# Patient Record
Sex: Female | Born: 1960 | Race: White | Hispanic: No | Marital: Single | Smoking: Former smoker
Health system: Southern US, Community
[De-identification: ages and names within clinical notes are randomized; demographics above are authoritative.]

## PROBLEM LIST (undated history)

## (undated) DIAGNOSIS — E079 Disorder of thyroid, unspecified: Secondary | ICD-10-CM

## (undated) DIAGNOSIS — M1990 Unspecified osteoarthritis, unspecified site: Secondary | ICD-10-CM

## (undated) DIAGNOSIS — K589 Irritable bowel syndrome without diarrhea: Secondary | ICD-10-CM

## (undated) HISTORY — PX: CHOLECYSTECTOMY: SHX55

## (undated) HISTORY — PX: ABDOMINAL HYSTERECTOMY: SHX81

---

## 2005-12-21 ENCOUNTER — Ambulatory Visit: Payer: Self-pay | Admitting: General Practice

## 2007-11-15 ENCOUNTER — Ambulatory Visit: Payer: Self-pay | Admitting: Family Medicine

## 2008-03-05 ENCOUNTER — Ambulatory Visit: Payer: Self-pay | Admitting: Family Medicine

## 2011-05-07 ENCOUNTER — Ambulatory Visit: Payer: Self-pay | Admitting: Otolaryngology

## 2011-08-21 ENCOUNTER — Ambulatory Visit: Payer: Self-pay

## 2012-09-23 ENCOUNTER — Ambulatory Visit: Payer: Self-pay | Admitting: Emergency Medicine

## 2012-09-23 LAB — RAPID STREP-A WITH REFLX: Micro Text Report: NEGATIVE

## 2012-09-25 LAB — BETA STREP CULTURE(ARMC)

## 2012-10-05 IMAGING — CR DG CHEST 2V
1 series · 2 of 2 positions shown · non-contrast
Comparison: none

REASON FOR EXAM: + productive cough with diminished breath sounds
COMMENTS:   LMP: Post Hysterectomy

[Series 1: pa · 0.17mm/px · 2 of 2 slices shown]
[im 1/2]
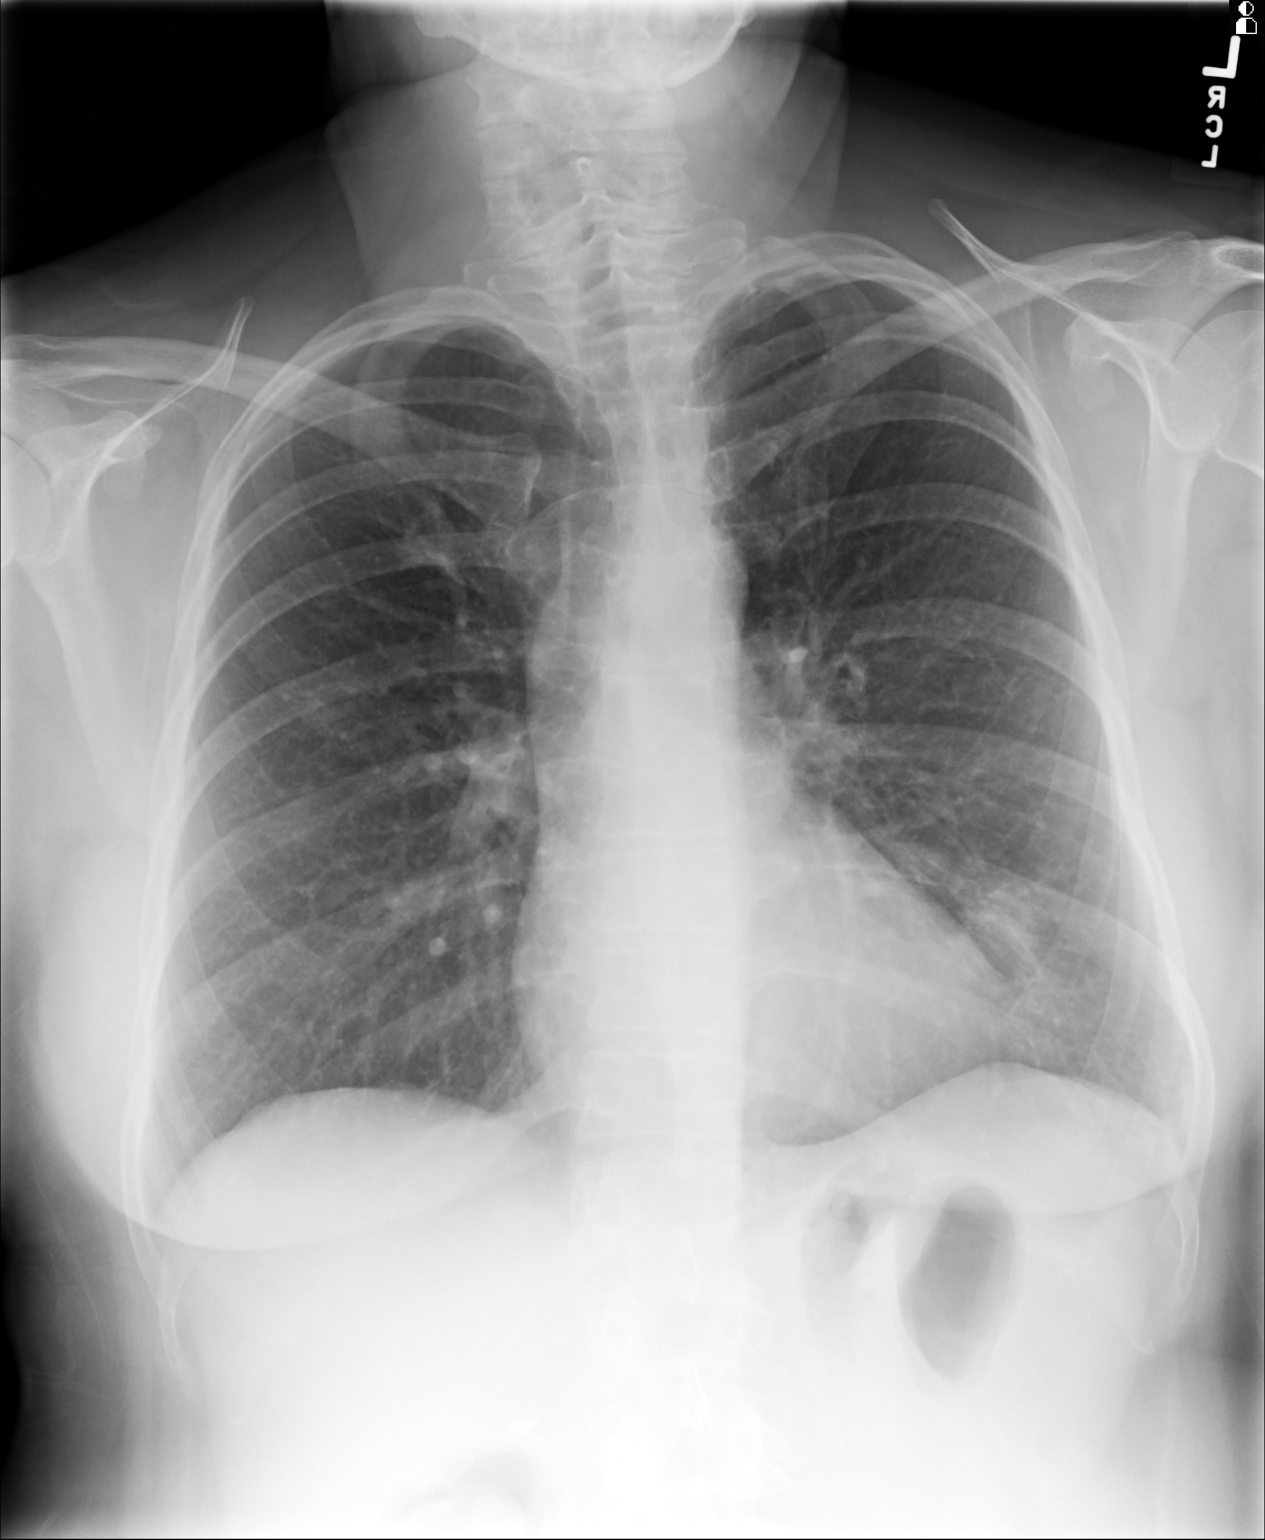
[im 2/2]
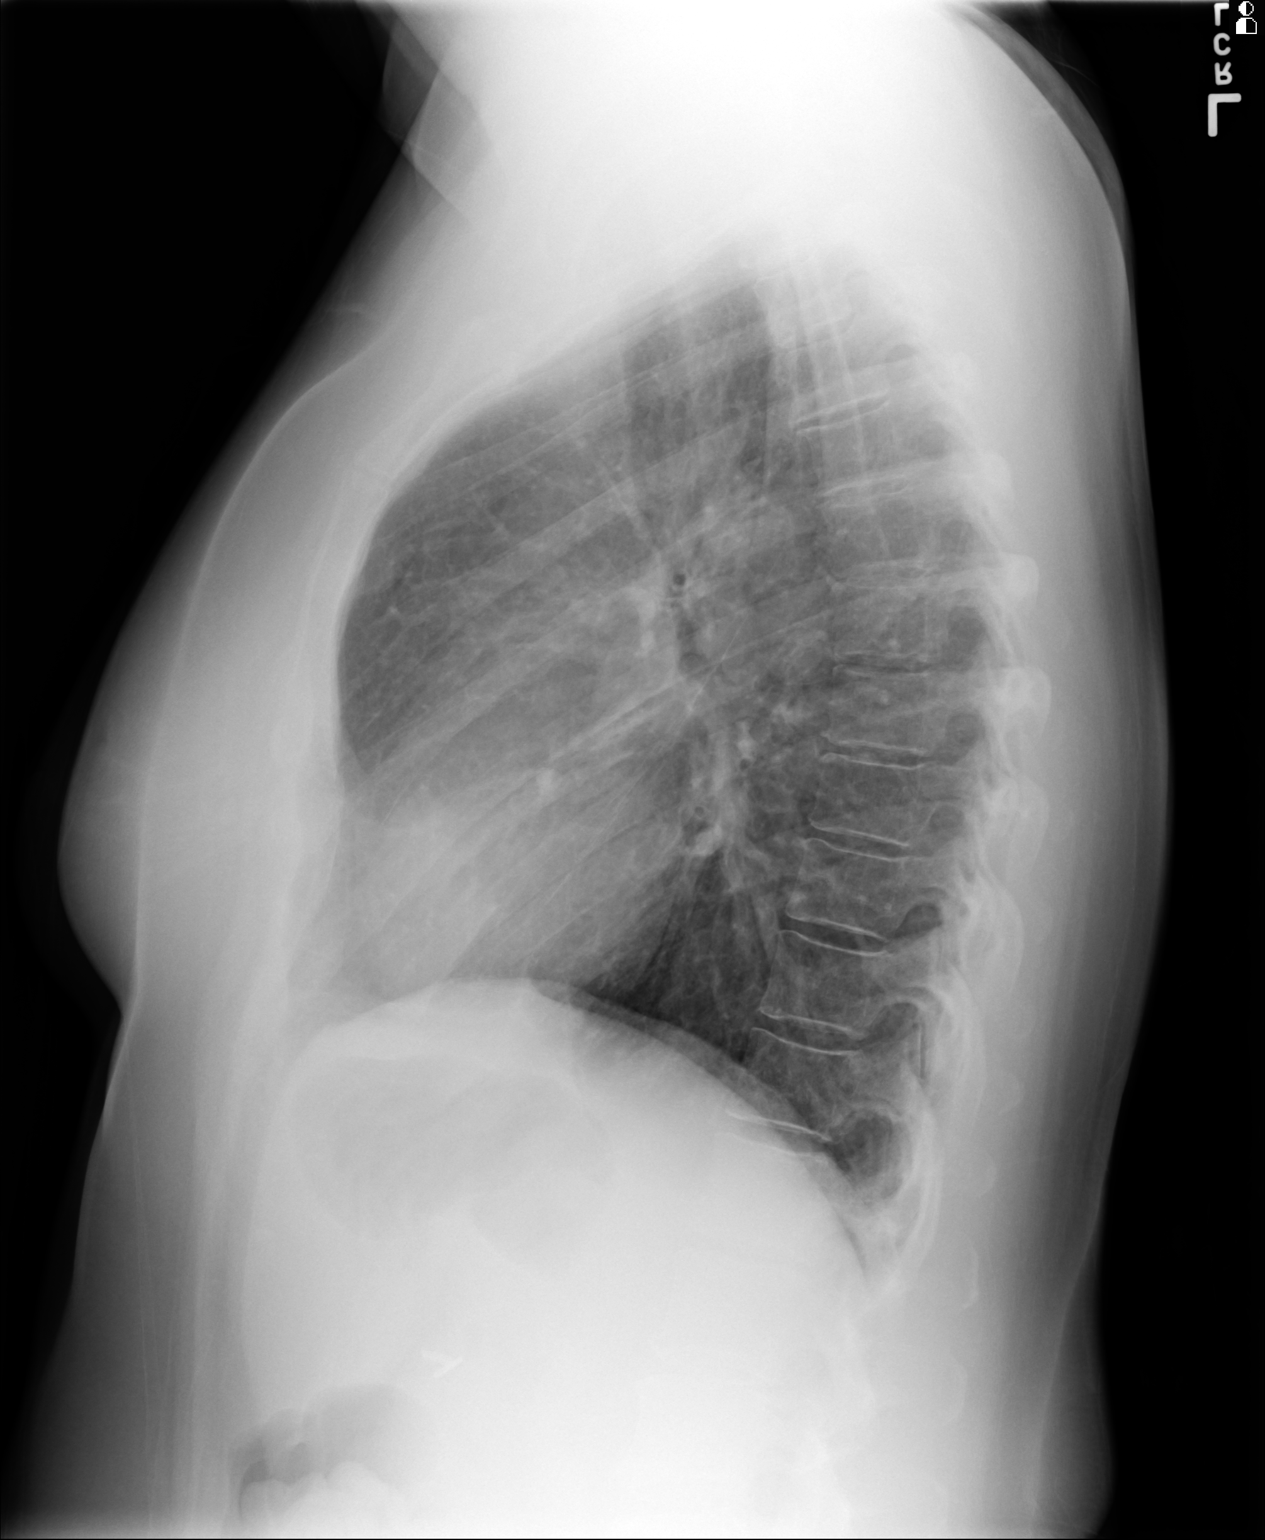

[2 of 2 positions shown; findings below may reference images not displayed]

PROCEDURE:     MDR - MDR CHEST PA(OR AP) AND LATERAL  - August 21, 2011  [DATE]

RESULT:     Comparison is made to the study 07 May, 2011.

The lungs are well-expanded. There is patchy density in the lingula
suggesting atelectasis or early pneumonia. The cardiac silhouette is normal
in size. The pulmonary vascularity is not engorged. I see no significant
pleural fluid collection. The mediastinum is normal in width.
IMPRESSION: The findings are consistent with atelectasis or developing
infiltrate in the lingula. Followup films following therapy would be useful
to assure clearing.

## 2013-11-08 ENCOUNTER — Ambulatory Visit: Payer: Self-pay | Admitting: Emergency Medicine

## 2013-11-08 ENCOUNTER — Emergency Department: Payer: Self-pay | Admitting: Emergency Medicine

## 2013-11-08 LAB — COMPREHENSIVE METABOLIC PANEL
AST: 24 U/L (ref 15–37)
Albumin: 3.6 g/dL (ref 3.4–5.0)
Alkaline Phosphatase: 107 U/L
Anion Gap: 6 — ABNORMAL LOW (ref 7–16)
BILIRUBIN TOTAL: 0.3 mg/dL (ref 0.2–1.0)
BUN: 14 mg/dL (ref 7–18)
CREATININE: 0.62 mg/dL (ref 0.60–1.30)
Calcium, Total: 9.8 mg/dL (ref 8.5–10.1)
Chloride: 105 mmol/L (ref 98–107)
Co2: 28 mmol/L (ref 21–32)
EGFR (African American): >60
EGFR (Non-African Amer.): >60
Glucose: 77 mg/dL (ref 65–99)
OSMOLALITY: 277 (ref 275–301)
POTASSIUM: 3.6 mmol/L (ref 3.5–5.1)
SGPT (ALT): 30 U/L (ref 12–78)
Sodium: 139 mmol/L (ref 136–145)
Total Protein: 7.6 g/dL (ref 6.4–8.2)

## 2013-11-08 LAB — CBC
HCT: 42.9 % (ref 35.0–47.0)
HGB: 14.3 g/dL (ref 12.0–16.0)
MCH: 30.4 pg (ref 26.0–34.0)
MCHC: 33.3 g/dL (ref 32.0–36.0)
MCV: 91 fL (ref 80–100)
PLATELETS: 282 10*3/uL (ref 150–440)
RBC: 4.7 10*6/uL (ref 3.80–5.20)
RDW: 13 % (ref 11.5–14.5)
WBC: 10.8 10*3/uL (ref 3.6–11.0)

## 2013-11-08 LAB — CK TOTAL AND CKMB (NOT AT ARMC)
CK, TOTAL: 82 U/L
CK-MB: 1 ng/mL (ref 0.5–3.6)

## 2013-11-08 LAB — TROPONIN I

## 2013-11-12 DIAGNOSIS — R079 Chest pain, unspecified: Secondary | ICD-10-CM | POA: Insufficient documentation

## 2013-11-12 DIAGNOSIS — J029 Acute pharyngitis, unspecified: Secondary | ICD-10-CM | POA: Insufficient documentation

## 2014-04-25 ENCOUNTER — Ambulatory Visit: Payer: Self-pay

## 2014-08-29 ENCOUNTER — Ambulatory Visit: Payer: Self-pay

## 2014-09-01 ENCOUNTER — Ambulatory Visit: Payer: Self-pay

## 2016-08-10 ENCOUNTER — Emergency Department: Payer: BLUE CROSS/BLUE SHIELD

## 2016-08-10 ENCOUNTER — Inpatient Hospital Stay
Admission: EM | Admit: 2016-08-10 | Discharge: 2016-08-11 | DRG: 392 | Disposition: A | Discharge status: Home / Self Care | Payer: BLUE CROSS/BLUE SHIELD | Attending: Internal Medicine | Admitting: Internal Medicine

## 2016-08-10 DIAGNOSIS — Z885 Allergy status to narcotic agent status: Secondary | ICD-10-CM

## 2016-08-10 DIAGNOSIS — E876 Hypokalemia: Secondary | ICD-10-CM | POA: Diagnosis present

## 2016-08-10 DIAGNOSIS — K625 Hemorrhage of anus and rectum: Secondary | ICD-10-CM | POA: Diagnosis present

## 2016-08-10 DIAGNOSIS — J101 Influenza due to other identified influenza virus with other respiratory manifestations: Secondary | ICD-10-CM | POA: Diagnosis present

## 2016-08-10 DIAGNOSIS — A09 Infectious gastroenteritis and colitis, unspecified: Secondary | ICD-10-CM | POA: Diagnosis present

## 2016-08-10 DIAGNOSIS — K922 Gastrointestinal hemorrhage, unspecified: Secondary | ICD-10-CM

## 2016-08-10 DIAGNOSIS — Z9049 Acquired absence of other specified parts of digestive tract: Secondary | ICD-10-CM | POA: Diagnosis not present

## 2016-08-10 DIAGNOSIS — K529 Noninfective gastroenteritis and colitis, unspecified: Secondary | ICD-10-CM

## 2016-08-10 DIAGNOSIS — Z880 Allergy status to penicillin: Secondary | ICD-10-CM

## 2016-08-10 HISTORY — DX: Irritable bowel syndrome, unspecified: K58.9

## 2016-08-10 LAB — COMPREHENSIVE METABOLIC PANEL
ALBUMIN: 3.9 g/dL (ref 3.5–5.0)
ALT: 32 U/L (ref 14–54)
AST: 35 U/L (ref 15–41)
Alkaline Phosphatase: 80 U/L (ref 38–126)
Anion gap: 8 (ref 5–15)
BUN: 18 mg/dL (ref 6–20)
CHLORIDE: 102 mmol/L (ref 101–111)
CO2: 28 mmol/L (ref 22–32)
Calcium: 9 mg/dL (ref 8.9–10.3)
Creatinine, Ser: 0.8 mg/dL (ref 0.44–1.00)
GFR calc Af Amer: >60 mL/min (ref >60)
Glucose, Bld: 99 mg/dL (ref 65–99)
POTASSIUM: 3.3 mmol/L — AB (ref 3.5–5.1)
SODIUM: 138 mmol/L (ref 135–145)
Total Bilirubin: 0.6 mg/dL (ref 0.3–1.2)
Total Protein: 7.4 g/dL (ref 6.5–8.1)

## 2016-08-10 LAB — INFLUENZA PANEL BY PCR (TYPE A & B)
INFLAPCR: POSITIVE — AB
Influenza B By PCR: NEGATIVE

## 2016-08-10 LAB — CBC
HEMATOCRIT: 44.5 % (ref 35.0–47.0)
Hemoglobin: 15.4 g/dL (ref 12.0–16.0)
MCH: 31 pg (ref 26.0–34.0)
MCHC: 34.7 g/dL (ref 32.0–36.0)
MCV: 89.3 fL (ref 80.0–100.0)
Platelets: 247 10*3/uL (ref 150–440)
RBC: 4.98 MIL/uL (ref 3.80–5.20)
RDW: 13.1 % (ref 11.5–14.5)
WBC: 6.2 10*3/uL (ref 3.6–11.0)

## 2016-08-10 LAB — PROTIME-INR
INR: 0.99
PROTHROMBIN TIME: 13.1 s (ref 11.4–15.2)

## 2016-08-10 LAB — TYPE AND SCREEN
ABO/RH(D): O POS
Antibody Screen: NEGATIVE

## 2016-08-10 LAB — MAGNESIUM: Magnesium: 2 mg/dL (ref 1.7–2.4)

## 2016-08-10 LAB — LIPASE, BLOOD: Lipase: 23 U/L (ref 11–51)

## 2016-08-10 LAB — APTT: APTT: 30 s (ref 24–36)

## 2016-08-10 LAB — HEMOGLOBIN: HEMOGLOBIN: 14.3 g/dL (ref 12.0–16.0)

## 2016-08-10 MED ORDER — FENTANYL CITRATE (PF) 100 MCG/2ML IJ SOLN
INTRAMUSCULAR | Status: AC
  Filled 2016-08-10: qty 2

## 2016-08-10 MED ORDER — ACETAMINOPHEN 650 MG RE SUPP: 650.0000 mg | Freq: Four times a day (QID) | RECTAL | Status: DC | PRN

## 2016-08-10 MED ORDER — CIPROFLOXACIN IN D5W 400 MG/200ML IV SOLN
400.0000 mg | Freq: Two times a day (BID) | INTRAVENOUS | Status: DC
  Administered 2016-08-11: 400 mg via INTRAVENOUS
  Filled 2016-08-10 (×3): qty 200

## 2016-08-10 MED ORDER — ONDANSETRON HCL 4 MG/2ML IJ SOLN
4.0000 mg | Freq: Once | INTRAMUSCULAR | Status: DC
  Filled 2016-08-10: qty 2

## 2016-08-10 MED ORDER — FENTANYL CITRATE (PF) 100 MCG/2ML IJ SOLN: 50.0000 ug | Freq: Once | INTRAMUSCULAR | Status: DC

## 2016-08-10 MED ORDER — CIPROFLOXACIN IN D5W 400 MG/200ML IV SOLN
400.0000 mg | Freq: Once | INTRAVENOUS | Status: AC
  Administered 2016-08-10: 400 mg via INTRAVENOUS

## 2016-08-10 MED ORDER — METRONIDAZOLE IN NACL 5-0.79 MG/ML-% IV SOLN
500.0000 mg | Freq: Once | INTRAVENOUS | Status: AC
  Administered 2016-08-10: 500 mg via INTRAVENOUS

## 2016-08-10 MED ORDER — POTASSIUM CHLORIDE IN NACL 20-0.9 MEQ/L-% IV SOLN
INTRAVENOUS | Status: DC
  Administered 2016-08-10 – 2016-08-11 (×2): via INTRAVENOUS
  Filled 2016-08-10 (×5): qty 1000

## 2016-08-10 MED ORDER — SODIUM CHLORIDE 0.9% FLUSH
3.0000 mL | Freq: Two times a day (BID) | INTRAVENOUS | Status: DC
  Administered 2016-08-10: 3 mL via INTRAVENOUS

## 2016-08-10 MED ORDER — IOPAMIDOL (ISOVUE-300) INJECTION 61%
30.0000 mL | Freq: Once | INTRAVENOUS | Status: AC
  Administered 2016-08-10: 30 mL via ORAL
  Filled 2016-08-10: qty 30

## 2016-08-10 MED ORDER — CIPROFLOXACIN IN D5W 400 MG/200ML IV SOLN
INTRAVENOUS | Status: AC
  Administered 2016-08-10: 400 mg via INTRAVENOUS
  Filled 2016-08-10: qty 200

## 2016-08-10 MED ORDER — METRONIDAZOLE IN NACL 5-0.79 MG/ML-% IV SOLN
500.0000 mg | Freq: Three times a day (TID) | INTRAVENOUS | Status: DC
  Administered 2016-08-11 (×2): 500 mg via INTRAVENOUS
  Filled 2016-08-10 (×5): qty 100

## 2016-08-10 MED ORDER — IOPAMIDOL (ISOVUE-300) INJECTION 61%
100.0000 mL | Freq: Once | INTRAVENOUS | Status: AC | PRN
  Administered 2016-08-10: 100 mL via INTRAVENOUS
  Filled 2016-08-10: qty 100

## 2016-08-10 MED ORDER — OLOPATADINE HCL 0.1 % OP SOLN
1.0000 [drp] | Freq: Two times a day (BID) | OPHTHALMIC | Status: DC
  Filled 2016-08-10: qty 5

## 2016-08-10 MED ORDER — PANTOPRAZOLE SODIUM 40 MG IV SOLR
40.0000 mg | Freq: Two times a day (BID) | INTRAVENOUS | Status: DC
  Administered 2016-08-10: 40 mg via INTRAVENOUS
  Filled 2016-08-10 (×2): qty 40

## 2016-08-10 MED ORDER — OSELTAMIVIR PHOSPHATE 75 MG PO CAPS
75.0000 mg | ORAL_CAPSULE | Freq: Two times a day (BID) | ORAL | Status: DC
  Administered 2016-08-10 – 2016-08-11 (×2): 75 mg via ORAL
  Filled 2016-08-10 (×2): qty 1

## 2016-08-10 MED ORDER — SODIUM CHLORIDE 0.9 % IV BOLUS (SEPSIS)
1000.0000 mL | Freq: Once | INTRAVENOUS | Status: AC
  Administered 2016-08-10: 1000 mL via INTRAVENOUS

## 2016-08-10 MED ORDER — ONDANSETRON HCL 4 MG PO TABS: 4.0000 mg | ORAL_TABLET | Freq: Four times a day (QID) | ORAL | Status: DC | PRN

## 2016-08-10 MED ORDER — METRONIDAZOLE IN NACL 5-0.79 MG/ML-% IV SOLN
INTRAVENOUS | Status: AC
  Administered 2016-08-10: 500 mg via INTRAVENOUS
  Filled 2016-08-10: qty 100

## 2016-08-10 MED ORDER — OLOPATADINE HCL 0.7 % OP SOLN
1.0000 [drp] | Freq: Every day | OPHTHALMIC | Status: DC
  Filled 2016-08-10: qty 2.5

## 2016-08-10 MED ORDER — ONDANSETRON HCL 4 MG/2ML IJ SOLN
4.0000 mg | Freq: Four times a day (QID) | INTRAMUSCULAR | Status: DC | PRN
  Administered 2016-08-11 (×2): 4 mg via INTRAVENOUS
  Filled 2016-08-10 (×2): qty 2

## 2016-08-10 MED ORDER — ACETAMINOPHEN 325 MG PO TABS
650.0000 mg | ORAL_TABLET | Freq: Four times a day (QID) | ORAL | Status: DC | PRN
  Administered 2016-08-11: 650 mg via ORAL
  Filled 2016-08-10: qty 2

## 2016-08-10 NOTE — ED Notes (Signed)
Transporting patient to room 226-2C with EDT Almyra Free

## 2016-08-10 NOTE — H&P (Addendum)
[facility] [place] NAME: Yolanda Guerrero    MR#:  [id number]  DATE OF BIRTH:  20-May-1961  DATE OF ADMISSION:  08/10/2016  PRIMARY CARE PHYSICIAN: Verita Lamb, NP   REQUESTING/REFERRING PHYSICIAN:   CHIEF COMPLAINT:   Chief Complaint  Patient presents with  . Rectal Bleeding  . Diarrhea    HISTORY OF PRESENT ILLNESS: Yolanda Guerrero  is a 56 y.o. female with a known history of Irritable bowel syndrome, who presents to the hospital with complaints of rectal bleed. However, due to the patient, she was doing well up until 4 days ago, which is that he had a high fever and headache. She was seen at urgent care 2 days ago, they were not able to test her for influenza, so they gave her a Zithromax prescription as well as Zofran for nausea. Patient has been experiencing and she was sent back home. After returning back home next day she started having lower abdominal pain and rectal bleeding. She has been going to the bathroom numerous times, however, not for diarrhea but for intermittent rectal bleeding. The blood was described as bright red. No clots are competent with some tenesmus but not diarrheal stool. Patient also admitted of nausea and a few episodes of vomiting, no hematemesis.  On arrival to the hospital the patient was not hypotensive, but tachycardic. Labs revealed influenza A. Hospitalist services were contacted for admission  PAST MEDICAL HISTORY:   Past Medical History:  Diagnosis Date  . IBS (irritable bowel syndrome)     PAST SURGICAL HISTORY: History reviewed. No pertinent surgical history.  SOCIAL HISTORY:  Social History  Substance Use Topics  . Smoking status: Never Smoker  . Smokeless tobacco: Never Used  . Alcohol use No    FAMILY HISTORY: .  DRUG ALLERGIES:  Allergies  Allergen Reactions  . Codeine Nausea And Vomiting  . Penicillins Hives    Has patient had a PCN reaction causing immediate rash,  facial/tongue/throat swelling, SOB or lightheadedness with hypotension: No Has patient had a PCN reaction causing severe rash involving mucus membranes or skin necrosis: No Has patient had a PCN reaction that required hospitalization: No Has patient had a PCN reaction occurring within the last 10 years: Yes If all of the above answers are "NO", then may proceed with Cephalosporin use.  Marland Kitchen Percocet [Oxycodone-Acetaminophen] Nausea And Vomiting  . Tramadol Nausea And Vomiting    Review of Systems  Constitutional: Positive for fever and malaise/fatigue. Negative for chills and weight loss.  HENT: Positive for congestion.   Eyes: Negative for blurred vision and double vision.  Respiratory: Positive for cough and sputum production. Negative for shortness of breath and wheezing.   Cardiovascular: Negative for chest pain, palpitations, orthopnea, leg swelling and PND.  Gastrointestinal: Positive for abdominal pain, blood in stool, constipation, nausea and vomiting. Negative for diarrhea.  Genitourinary: Negative for dysuria, frequency, hematuria and urgency.  Musculoskeletal: Negative for falls.  Neurological: Negative for dizziness, tremors, focal weakness and headaches.  Endo/Heme/Allergies: Does not bruise/bleed easily.  Psychiatric/Behavioral: Negative for depression. The patient does not have insomnia.     MEDICATIONS AT HOME:  Prior to Admission medications   Medication Sig Start Date End Date Taking? Authorizing Provider  azithromycin (ZITHROMAX) 250 MG tablet Take 250-500 mg by mouth daily. Take 2 tablets on day 1 then take 1 tablet daily until complete. 08/09/16 08/14/16 Yes Historical Provider, MD  ondansetron (ZOFRAN-ODT) 4 MG disintegrating tablet Take 4 mg  by mouth every 8 (eight) hours as needed. 08/08/16 08/15/16 Yes Historical Provider, MD  PAZEO 0.7 % SOLN Apply 1 drop to eye daily. 06/26/16  Yes Historical Provider, MD      PHYSICAL EXAMINATION:   VITAL SIGNS: Blood pressure (!)  144/87, pulse (!) 102, temperature 98.2 F (36.8 C), temperature source Oral, resp. rate 18, height 5\' 3"  (1.6 m), weight 79.8 kg (176 lb), SpO2 98 %.  GENERAL:  56 y.o.-year-old patient lying in the bed in mild distress due to abdominal discomfort, intermittent cough.  EYES: Pupils equal, round, reactive to light and accommodation. No scleral icterus. Extraocular muscles intact.  HEENT: Head atraumatic, normocephalic. Oropharynx and nasopharynx clear.  NECK:  Supple, no jugular venous distention. No thyroid enlargement, no tenderness.  LUNGS: Markedly diminished breath sounds bilaterally posteriorly, no wheezing, rales,rhonchi or crepitation. No use of accessory muscles of respiration.  CARDIOVASCULAR: S1, S2 normal. No murmurs, rubs, or gallops.  ABDOMEN: Soft, tender in left lower quadrant but no rebound or guarding, nondistended. Bowel sounds present. No organomegaly or mass.  EXTREMITIES: Trace lower extremity and pedal edema, no cyanosis, or clubbing.  NEUROLOGIC: Cranial nerves II through XII are intact. Muscle strength 5/5 in all extremities. Sensation intact. Gait not checked.  PSYCHIATRIC: The patient is alert and oriented x 3.  SKIN: No obvious rash, lesion, or ulcer.   LABORATORY PANEL:   CBC  Recent Labs Lab 08/10/16 1145  WBC 6.2  HGB 15.4  HCT 44.5  PLT 247  MCV 89.3  MCH 31.0  MCHC 34.7  RDW 13.1   ------------------------------------------------------------------------------------------------------------------  Chemistries   Recent Labs Lab 08/10/16 1145  NA 138  K 3.3*  CL 102  CO2 28  GLUCOSE 99  BUN 18  CREATININE 0.80  CALCIUM 9.0  AST 35  ALT 32  ALKPHOS 80  BILITOT 0.6   ------------------------------------------------------------------------------------------------------------------  Cardiac Enzymes No results for input(s): TROPONINI in the last 168  hours. ------------------------------------------------------------------------------------------------------------------  RADIOLOGY: Ct Abdomen Pelvis W Contrast  Result Date: 08/10/2016 CLINICAL DATA:  Acute onset of fever, headache and left lower quadrant abdominal pain. Initial encounter. EXAM: CT ABDOMEN AND PELVIS WITH CONTRAST TECHNIQUE: Multidetector CT imaging of the abdomen and pelvis was performed using the standard protocol following bolus administration of intravenous contrast. CONTRAST:  150mL ISOVUE-300 IOPAMIDOL (ISOVUE-300) INJECTION 61% COMPARISON:  None. FINDINGS: Lower chest: The visualized lung bases are grossly clear. The visualized portions of the mediastinum are unremarkable. Hepatobiliary: A 1.9 cm hypodensity is noted at the posterior aspect of the liver. The patient is status post cholecystectomy, with clips noted at the gallbladder fossa. The common bile duct remains within normal limits given prior cholecystectomy. Pancreas: The pancreas is within normal limits. Spleen: The spleen is unremarkable in appearance. Adrenals/Urinary Tract: The adrenal glands are unremarkable in appearance. The kidneys are within normal limits. There is no evidence of hydronephrosis. No renal or ureteral stones are identified. No perinephric stranding is seen. Stomach/Bowel: The appendix is not visualized; there is no evidence of appendicitis. Contrast progresses to the level of the mid transverse colon. There is diffuse mucosal edema and soft tissue inflammation along the distal transverse and descending colon, concerning for infectious or inflammatory segmental colitis. There is no definite evidence of ischemia. The sigmoid colon is unremarkable in appearance. Small bowel loops are unremarkable in appearance. The stomach is partially filled with contrast and within normal limits. Vascular/Lymphatic: Scattered calcification is seen along the abdominal aorta and its branches. The abdominal aorta is  otherwise grossly  unremarkable. The inferior vena cava is grossly unremarkable. No retroperitoneal lymphadenopathy is seen. No pelvic sidewall lymphadenopathy is identified. Reproductive: The bladder is mildly distended and grossly unremarkable in appearance. The patient is status post hysterectomy. No suspicious adnexal masses are seen. The ovaries are relatively symmetric in appearance. Other: No additional soft tissue abnormalities are seen. Musculoskeletal: No acute osseous abnormalities are identified. The visualized musculature is unremarkable in appearance. IMPRESSION: 1. Diffuse mucosal edema and soft tissue inflammation along the distal transverse and descending colon, concerning for segmental colitis, either infectious or inflammatory in nature. 2. Nonspecific 1.9 cm hypodensity at the posterior aspect of the liver. Would correlate with LFTs, and consider further evaluation if deemed clinically appropriate. 3. Scattered aortic atherosclerosis. Electronically Signed   By: Garald Balding M.D.   On: 08/10/2016 18:07    EKG: Orders placed or performed during the hospital encounter of 08/10/16  . ED EKG  . ED EKG  EKG in the ED revealed normal sinus rhythm at 96 bpm, normal axis, nonspecific ST-T changes  IMPRESSION AND PLAN:  Active Problems:   Gastrointestinal bleeding   Acute hemorrhagic colitis   Influenza A   Hypokalemia #1. Gastrointestinal bleeding due to colitis, concerning for ischemic due to location, sedimentation medical floor, continue IV fluids, discussed the risks as well as benefits of transfusion, patient was agreeable for transfusion if needed, get gastroenterologist involved for further recommendations, initiate PPI intravenously twice a day, initiate clear liquid diet. Follow hemoglobin level and transfuse as needed #2. Acute colitis, questionable ischemic due to location, initiate patient on Cipro and Flagyl intravenously clear liquid diet, advance diet as tolerated #3  influenza A, start Tamiflu #4. Hypokalemia, supplement intravenously   All the records are reviewed and case discussed with ED provider. Management plans discussed with the patient, family and they are in agreement.  CODE STATUS: Code Status History    This patient does not have a recorded code status. Please follow your organizational policy for patients in this situation.       TOTAL TIME TAKING CARE OF THIS PATIENT: 50 minutes.    Theodoro Grist M.D on 08/10/2016 at 8:43 PM  Between 7am to 6pm - Pager - [phone] After 6pm go to [email or web address] - password [email or web address] [facility]  [facility]  Office  [phone]  CC: Primary care physician; Verita Lamb, NP

## 2016-08-10 NOTE — ED Triage Notes (Signed)
Pt started on azithromycin on Sunday by PCP, diarrhea and bright red stools with diarrhea since. Lower abdominal cramping. Pt alert and oriented X4, active, cooperative, pt in NAD. RR even and unlabored, color WNL.

## 2016-08-10 NOTE — ED Provider Notes (Signed)
[facility] Emergency Department Provider Note  ____________________________________________  Time seen: Approximately 3:30 PM  I have reviewed the triage vital signs and the nursing notes.   HISTORY  Chief Complaint Rectal Bleeding and Diarrhea    HPI Yolanda Guerrero is a 56 y.o. female with a history of IBS presenting with bright red blood per rectum. The patient reports that 5 or 6 days ago, she developed a headache with fever and nausea, and was put on Zofran and azithromycinby her primary care physician. Shortly after taking her first dose of azithromycin, she developed severe abdominal cramping and diarrhea that lasted approximately one hour. Since then, she has persisted in having diarrhea, and yesterday started having just bright red blood mixed with mucus. She does have a history of hemorrhoids. Today, she continued to have more bleeding, and developed lightheadedness with standing. She denies any syncope, chest pain or shortness of breath. She is not had a fever in greater than 48 hours.   Past Medical History:  Diagnosis Date  . IBS (irritable bowel syndrome)     There are no active problems to display for this patient.   History reviewed. No pertinent surgical history.    Allergies Codeine; Penicillins; and Percocet [oxycodone-acetaminophen]  No family history on file.  Social History Social History  Substance Use Topics  . Smoking status: Never Smoker  . Smokeless tobacco: Never Used  . Alcohol use No    Review of Systems Constitutional: Positive fever. Positive myalgias. Positive general malaise. Positive lightheadedness with standing. Negative syncope. Eyes: No visual changes. ENT: No sore throat. No congestion or rhinorrhea. Cardiovascular: Denies chest pain. Denies palpitations. Respiratory: Denies shortness of breath.  Positive cough. Gastrointestinal: Positive lower abdominal cramping, worse on the left side.  No nausea,  no vomiting.  Positive diarrhea.  No constipation. Positive bright red blood per rectum. Genitourinary: Negative for dysuria. Musculoskeletal: Negative for back pain. Skin: Negative for rash. Neurological: Negative for headaches. No focal numbness, tingling or weakness.   10-point ROS otherwise negative.  ____________________________________________   PHYSICAL EXAM:  VITAL SIGNS: ED Triage Vitals  Enc Vitals Group     BP 08/10/16 1146 (!) 144/87     Pulse Rate 08/10/16 1146 (!) 102     Resp 08/10/16 1146 18     Temp 08/10/16 1146 98.2 F (36.8 C)     Temp Source 08/10/16 1146 Oral     SpO2 08/10/16 1146 98 %     Weight 08/10/16 1147 176 lb (79.8 kg)     Height 08/10/16 1147 5\' 3"  (1.6 m)     Head Circumference --      Peak Flow --      Pain Score 08/10/16 1147 5     Pain Loc --      Pain Edu? --      Excl. in [place]? --     Constitutional: Alert and oriented. Well appearing and in no acute distress. Answers questions appropriately. Eyes: Conjunctivae are normalAnd without pallor.  EOMI. No scleral icterus. Head: Atraumatic. Nose: No congestion/rhinnorhea. Mouth/Throat: Mucous membranes are moist.  Neck: No stridor.  Supple.  No meningismus. Cardiovascular: Normal rate, regular rhythm. No murmurs, rubs or gallops.  Respiratory: Normal respiratory effort.  No accessory muscle use or retractions. Lungs CTAB.  No wheezes, rales or ronchi. Gastrointestinal: Soft, overweight, and nondistended. Mild tenderness to palpation localized to the left lower quadrant. No guarding or rebound.  No peritoneal signs. Genitourinary: Multiple nonthrombosed, nonbleeding hemorrhoids externally. Positive  palpable internal hemorrhoid. Positive right red blood on my rectal examination is guaiac positive. Musculoskeletal: No LE edema.  Neurologic:  A&Ox3.  Speech is clear.  Face and smile are symmetric.  EOMI.  Moves all extremities well. Skin:  Skin is warm, dry and intact. No rash  noted. Psychiatric: Mood and affect are normal. Speech and behavior are normal.  Normal judgement.  ____________________________________________   LABS (all labs ordered are listed, but only abnormal results are displayed)  Labs Reviewed  COMPREHENSIVE METABOLIC PANEL - Abnormal; Notable for the following:       Result Value   Potassium 3.3 (*)    All other components within normal limits  INFLUENZA PANEL BY PCR (TYPE A & B) - Abnormal; Notable for the following:    Influenza A By PCR POSITIVE (*)    All other components within normal limits  CBC  LIPASE, BLOOD  PROTIME-INR  APTT  TYPE AND SCREEN   ____________________________________________  EKG  ED ECG REPORT I, Eula Listen, the attending physician, personally viewed and interpreted this ECG.   Date: 08/10/2016  EKG Time: 1647  Rate: 96  Rhythm: normal sinus rhythm  Axis: normal  Intervals:none  ST&T Change: Nonspecific T-wave inversions in V1. No ST elevations.  ____________________________________________  RADIOLOGY  Ct Abdomen Pelvis W Contrast  Result Date: 08/10/2016 CLINICAL DATA:  Acute onset of fever, headache and left lower quadrant abdominal pain. Initial encounter. EXAM: CT ABDOMEN AND PELVIS WITH CONTRAST TECHNIQUE: Multidetector CT imaging of the abdomen and pelvis was performed using the standard protocol following bolus administration of intravenous contrast. CONTRAST:  161mL ISOVUE-300 IOPAMIDOL (ISOVUE-300) INJECTION 61% COMPARISON:  None. FINDINGS: Lower chest: The visualized lung bases are grossly clear. The visualized portions of the mediastinum are unremarkable. Hepatobiliary: A 1.9 cm hypodensity is noted at the posterior aspect of the liver. The patient is status post cholecystectomy, with clips noted at the gallbladder fossa. The common bile duct remains within normal limits given prior cholecystectomy. Pancreas: The pancreas is within normal limits. Spleen: The spleen is unremarkable in  appearance. Adrenals/Urinary Tract: The adrenal glands are unremarkable in appearance. The kidneys are within normal limits. There is no evidence of hydronephrosis. No renal or ureteral stones are identified. No perinephric stranding is seen. Stomach/Bowel: The appendix is not visualized; there is no evidence of appendicitis. Contrast progresses to the level of the mid transverse colon. There is diffuse mucosal edema and soft tissue inflammation along the distal transverse and descending colon, concerning for infectious or inflammatory segmental colitis. There is no definite evidence of ischemia. The sigmoid colon is unremarkable in appearance. Small bowel loops are unremarkable in appearance. The stomach is partially filled with contrast and within normal limits. Vascular/Lymphatic: Scattered calcification is seen along the abdominal aorta and its branches. The abdominal aorta is otherwise grossly unremarkable. The inferior vena cava is grossly unremarkable. No retroperitoneal lymphadenopathy is seen. No pelvic sidewall lymphadenopathy is identified. Reproductive: The bladder is mildly distended and grossly unremarkable in appearance. The patient is status post hysterectomy. No suspicious adnexal masses are seen. The ovaries are relatively symmetric in appearance. Other: No additional soft tissue abnormalities are seen. Musculoskeletal: No acute osseous abnormalities are identified. The visualized musculature is unremarkable in appearance. IMPRESSION: 1. Diffuse mucosal edema and soft tissue inflammation along the distal transverse and descending colon, concerning for segmental colitis, either infectious or inflammatory in nature. 2. Nonspecific 1.9 cm hypodensity at the posterior aspect of the liver. Would correlate with LFTs, and consider further evaluation  if deemed clinically appropriate. 3. Scattered aortic atherosclerosis. Electronically Signed   By: Garald Balding M.D.   On: 08/10/2016 18:07     ____________________________________________   PROCEDURES  Procedure(s) performed: None  Procedures  Critical Care performed: No ____________________________________________   INITIAL IMPRESSION / ASSESSMENT AND PLAN / ED COURSE  Pertinent labs & imaging results that were available during my care of the patient were reviewed by me and considered in my medical decision making (see chart for details).  56 y.o. female with a history of IBS and hemorrhoids, presenting with a febrile illness associated with headache, cough, and now with diarrhea and bright red blood per rectum, left lower quadrant pain. It is possible that the patient has influenza. However she may have 2 concurrent illnesses going on. I would also consider a viral or bacterial URI or pneumonia, and we will evaluate for diverticulitis. Her bleeding may also be hemorrhoid related given my physical exam findings. At this time, the patient is hemodynamically stable with a reassuring blood count and is not a candidate for transfusion at this time. However, given that she is having ongoing bleeding, she'll need to be monitored in the hospital.  ----------------------------------------- 5:34 PM on 08/10/2016 -----------------------------------------  The patient is positive for influenza A. Her symptoms started several days ago, and she is not a candidate for therapeutic relief from Tamiflu. I'm awaiting the results of her CT scan for final disposition  ----------------------------------------- 6:26 PM on 08/10/2016 -----------------------------------------  The patient has diffuse colitis on her CT scan. I will plan to treat her with ciprofloxacin and Flagyl for this. She will be admitted to the hospital given that she has ongoing bleeding, and in addition to an intra-abdominal infection also has influenza. ____________________________________________  FINAL CLINICAL IMPRESSION(S) / ED DIAGNOSES  Final diagnoses:   Influenza A  Colitis         NEW MEDICATIONS STARTED DURING THIS VISIT:  New Prescriptions   No medications on file      Eula Listen, MD 08/10/16 1827

## 2016-08-11 LAB — BASIC METABOLIC PANEL
ANION GAP: 7 (ref 5–15)
BUN: 12 mg/dL (ref 6–20)
CO2: 26 mmol/L (ref 22–32)
Calcium: 8 mg/dL — ABNORMAL LOW (ref 8.9–10.3)
Chloride: 105 mmol/L (ref 101–111)
Creatinine, Ser: 0.65 mg/dL (ref 0.44–1.00)
GFR calc Af Amer: >60 mL/min (ref >60)
GFR calc non Af Amer: >60 mL/min (ref >60)
GLUCOSE: 139 mg/dL — AB (ref 65–99)
POTASSIUM: 3.3 mmol/L — AB (ref 3.5–5.1)
Sodium: 138 mmol/L (ref 135–145)

## 2016-08-11 LAB — C DIFFICILE QUICK SCREEN W PCR REFLEX
C DIFFICLE (CDIFF) ANTIGEN: NEGATIVE
C Diff interpretation: NOT DETECTED
C Diff toxin: NEGATIVE

## 2016-08-11 LAB — CBC
HEMATOCRIT: 37.2 % (ref 35.0–47.0)
HEMOGLOBIN: 13 g/dL (ref 12.0–16.0)
MCH: 31.3 pg (ref 26.0–34.0)
MCHC: 34.9 g/dL (ref 32.0–36.0)
MCV: 89.8 fL (ref 80.0–100.0)
Platelets: 214 10*3/uL (ref 150–440)
RBC: 4.15 MIL/uL (ref 3.80–5.20)
RDW: 13 % (ref 11.5–14.5)
WBC: 4.7 10*3/uL (ref 3.6–11.0)

## 2016-08-11 LAB — GASTROINTESTINAL PANEL BY PCR, STOOL (REPLACES STOOL CULTURE)
ADENOVIRUS F40/41: NOT DETECTED
Astrovirus: NOT DETECTED
CRYPTOSPORIDIUM: NOT DETECTED
CYCLOSPORA CAYETANENSIS: NOT DETECTED
Campylobacter species: NOT DETECTED
ENTAMOEBA HISTOLYTICA: NOT DETECTED
Enteroaggregative E coli (EAEC): NOT DETECTED
Enteropathogenic E coli (EPEC): NOT DETECTED
Enterotoxigenic E coli (ETEC): NOT DETECTED
Giardia lamblia: NOT DETECTED
Norovirus GI/GII: NOT DETECTED
PLESIMONAS SHIGELLOIDES: NOT DETECTED
Rotavirus A: NOT DETECTED
SAPOVIRUS (I, II, IV, AND V): NOT DETECTED
SHIGA LIKE TOXIN PRODUCING E COLI (STEC): NOT DETECTED
Salmonella species: NOT DETECTED
Shigella/Enteroinvasive E coli (EIEC): NOT DETECTED
VIBRIO CHOLERAE: NOT DETECTED
VIBRIO SPECIES: NOT DETECTED
YERSINIA ENTEROCOLITICA: NOT DETECTED

## 2016-08-11 LAB — GLUCOSE, CAPILLARY: Glucose-Capillary: 86 mg/dL (ref 65–99)

## 2016-08-11 LAB — HEMOGLOBIN: Hemoglobin: 14 g/dL (ref 12.0–16.0)

## 2016-08-11 MED ORDER — CIPROFLOXACIN HCL 500 MG PO TABS: 500.0000 mg | ORAL_TABLET | Freq: Two times a day (BID) | ORAL | 0 refills | Status: DC

## 2016-08-11 MED ORDER — POTASSIUM CHLORIDE CRYS ER 20 MEQ PO TBCR
40.0000 meq | EXTENDED_RELEASE_TABLET | Freq: Once | ORAL | Status: AC
Start: 2016-08-11 — End: 2016-08-11
  Administered 2016-08-11: 40 meq via ORAL
  Filled 2016-08-11: qty 2

## 2016-08-11 MED ORDER — METRONIDAZOLE 500 MG PO TABS: 500.0000 mg | ORAL_TABLET | Freq: Three times a day (TID) | ORAL | 0 refills | Status: DC

## 2016-08-11 MED ORDER — OSELTAMIVIR PHOSPHATE 75 MG PO CAPS: 75.0000 mg | ORAL_CAPSULE | Freq: Two times a day (BID) | ORAL | 0 refills | Status: DC

## 2016-08-11 NOTE — Discharge Summary (Signed)
[facility] [place] NAME: Yolanda Guerrero    MR#:  [id number]  DATE OF BIRTH:  11/08/1960  DATE OF ADMISSION:  08/10/2016   ADMITTING PHYSICIAN: Theodoro Grist, MD  DATE OF DISCHARGE: No discharge date for patient encounter.  PRIMARY CARE PHYSICIAN: Verita Lamb, NP   ADMISSION DIAGNOSIS:   Colitis [K52.9] Influenza A [J10.1]  DISCHARGE DIAGNOSIS:   Active Problems:   Gastrointestinal bleeding   Acute hemorrhagic colitis   Influenza A   Hypokalemia   SECONDARY DIAGNOSIS:   Past Medical History:  Diagnosis Date  . IBS (irritable bowel syndrome)     HOSPITAL COURSE:   56yo female with a PMH of irritable bowel syndrome presented to the hospital with rectal bleeding, lower abdominal pain, nausea and vomiting. CT scan confirmed inflammation of the distal transverse/descending colon, consistent with gastrointestinal bleed due to colitis - concerning for ischemia due to location. No history of afib of vascular issues. Patient was also found to be positive for Influenza A and also mildly hypokalemic. She was given intravenous fluids with potassium supplementation and started on IV Protonix twice a day, IV cipro, IV flagyl, Tamiflu and a clear liquid diet. Oral potassium was initiated today to assist. Hemoglobin levels have gone from 14.3 to 13, but stable given rehydration. Patient was able to tolerate soft diet today and denied any bleeding, pain, nausea or vomiting. She has a scheduled appointment with her PCP in February and will f/u with GI as outpatient.   1) GI bleed due to colitis - Patient will continue cipro & flagyl by mouth for total 10 day course  - follow up with GI as outpatient - schedule colonoscopy   2) Influenza A - Continue Tamiflu by mouth for total 5 day course  4) Hypokalemia  - resolved  DISCHARGE CONDITIONS:   Stable. Taking good nutrition by mouth. Ready for discharge.  CONSULTS OBTAINED:    N/A  DRUG ALLERGIES:   Allergies  Allergen Reactions  . Codeine Nausea And Vomiting  . Penicillins Hives    Has patient had a PCN reaction causing immediate rash, facial/tongue/throat swelling, SOB or lightheadedness with hypotension: No Has patient had a PCN reaction causing severe rash involving mucus membranes or skin necrosis: No Has patient had a PCN reaction that required hospitalization: No Has patient had a PCN reaction occurring within the last 10 years: Yes If all of the above answers are "NO", then may proceed with Cephalosporin use.  Marland Kitchen Percocet [Oxycodone-Acetaminophen] Nausea And Vomiting  . Tramadol Nausea And Vomiting   DISCHARGE MEDICATIONS:   Allergies as of 08/11/2016      Reactions   Codeine Nausea And Vomiting   Penicillins Hives   Has patient had a PCN reaction causing immediate rash, facial/tongue/throat swelling, SOB or lightheadedness with hypotension: No Has patient had a PCN reaction causing severe rash involving mucus membranes or skin necrosis: No Has patient had a PCN reaction that required hospitalization: No Has patient had a PCN reaction occurring within the last 10 years: Yes If all of the above answers are "NO", then may proceed with Cephalosporin use.   Percocet [oxycodone-acetaminophen] Nausea And Vomiting   Tramadol Nausea And Vomiting      Medication List    STOP taking these medications   azithromycin 250 MG tablet Commonly known as:  ZITHROMAX     TAKE these medications   ciprofloxacin 500 MG tablet Commonly known as:  CIPRO Take 1 tablet (500 mg  total) by mouth 2 (two) times daily. X 9 more days   metroNIDAZOLE 500 MG tablet Commonly known as:  FLAGYL Take 1 tablet (500 mg total) by mouth 3 (three) times daily. X 9 more days   ondansetron 4 MG disintegrating tablet Commonly known as:  ZOFRAN-ODT Take 4 mg by mouth every 8 (eight) hours as needed.   oseltamivir 75 MG capsule Commonly known as:  TAMIFLU Take 1 capsule (75  mg total) by mouth 2 (two) times daily. X 4 more days   PAZEO 0.7 % Soln Generic drug:  Olopatadine HCl Apply 1 drop to eye daily.        DISCHARGE INSTRUCTIONS:    DIET:   Regular diet  ACTIVITY:   Activity as tolerated  OXYGEN:   Home Oxygen: No.  Oxygen Delivery: room air  DISCHARGE LOCATION:   home   If you experience worsening of your admission symptoms, develop shortness of breath, life threatening emergency, suicidal or homicidal thoughts you must seek medical attention immediately by calling 911 or calling your MD immediately  if symptoms less severe.  You Must read complete instructions/literature along with all the possible adverse reactions/side effects for all the Medicines you take and that have been prescribed to you. Take any new Medicines after you have completely understood and accpet all the possible adverse reactions/side effects.   Please note  You were cared for by a hospitalist during your hospital stay. If you have any questions about your discharge medications or the care you received while you were in the hospital after you are discharged, you can call the unit and asked to speak with the hospitalist on call if the hospitalist that took care of you is not available. Once you are discharged, your primary care physician will handle any further medical issues. Please note that NO REFILLS for any discharge medications will be authorized once you are discharged, as it is imperative that you return to your primary care physician (or establish a relationship with a primary care physician if you do not have one) for your aftercare needs so that they can reassess your need for medications and monitor your lab values.    On the day of Discharge:  VITAL SIGNS:   Blood pressure 125/74, pulse 92, temperature 98 F (36.7 C), temperature source Oral, resp. rate 18, height 5\' 3"  (1.6 m), weight 79.8 kg (176 lb), SpO2 99 %.  PHYSICAL EXAMINATION:    GENERAL:   56 y.o.-year-old patient lying in the bed with no acute distress.  EYES: Pupils equal, round, reactive to light and accommodation. No scleral icterus. Extraocular muscles intact.  HEENT: Head atraumatic, normocephalic. Oropharynx and nasopharynx clear.  NECK:  Supple, no jugular venous distention. No thyroid enlargement, no tenderness.  LUNGS: Normal breath sounds bilaterally, no wheezing, rales,rhonchi or crepitation. No use of accessory muscles of respiration.  CARDIOVASCULAR: S1, S2 normal. No murmurs, rubs, or gallops.  ABDOMEN: Soft, non-tender, non-distended. Bowel sounds present. No organomegaly or mass.  EXTREMITIES: No pedal edema, cyanosis, or clubbing.  NEUROLOGIC: Cranial nerves II through XII are intact. Muscle strength 5/5 in all extremities. Sensation intact. Gait not checked.  PSYCHIATRIC: The patient is alert and oriented x 3.  SKIN: No obvious rash, lesion, or ulcer.   DATA REVIEW:   CBC  Recent Labs Lab 08/11/16 0445 08/11/16 1211  WBC 4.7  --   HGB 13.0 14.0  HCT 37.2  --   PLT 214  --     Chemistries  Recent Labs Lab 08/10/16 1145 08/11/16 0445  NA 138 138  K 3.3* 3.3*  CL 102 105  CO2 28 26  GLUCOSE 99 139*  BUN 18 12  CREATININE 0.80 0.65  CALCIUM 9.0 8.0*  MG 2.0  --   AST 35  --   ALT 32  --   ALKPHOS 80  --   BILITOT 0.6  --      Microbiology Results  Results for orders placed or performed during the hospital encounter of 08/10/16  C difficile quick scan w PCR reflex     Status: None   Collection Time: 08/11/16 12:25 AM  Result Value Ref Range Status   C Diff antigen NEGATIVE NEGATIVE Final   C Diff toxin NEGATIVE NEGATIVE Final   C Diff interpretation No C. difficile detected.  Final  Gastrointestinal Panel by PCR , Stool     Status: None   Collection Time: 08/11/16 12:25 AM  Result Value Ref Range Status   Campylobacter species NOT DETECTED NOT DETECTED Final   Plesimonas shigelloides NOT DETECTED NOT DETECTED Final    Salmonella species NOT DETECTED NOT DETECTED Final   Yersinia enterocolitica NOT DETECTED NOT DETECTED Final   Vibrio species NOT DETECTED NOT DETECTED Final   Vibrio cholerae NOT DETECTED NOT DETECTED Final   Enteroaggregative E coli (EAEC) NOT DETECTED NOT DETECTED Final   Enteropathogenic E coli (EPEC) NOT DETECTED NOT DETECTED Final   Enterotoxigenic E coli (ETEC) NOT DETECTED NOT DETECTED Final   Shiga like toxin producing E coli (STEC) NOT DETECTED NOT DETECTED Final   Shigella/Enteroinvasive E coli (EIEC) NOT DETECTED NOT DETECTED Final   Cryptosporidium NOT DETECTED NOT DETECTED Final   Cyclospora cayetanensis NOT DETECTED NOT DETECTED Final   Entamoeba histolytica NOT DETECTED NOT DETECTED Final   Giardia lamblia NOT DETECTED NOT DETECTED Final   Adenovirus F40/41 NOT DETECTED NOT DETECTED Final   Astrovirus NOT DETECTED NOT DETECTED Final   Norovirus GI/GII NOT DETECTED NOT DETECTED Final   Rotavirus A NOT DETECTED NOT DETECTED Final   Sapovirus (I, II, IV, and V) NOT DETECTED NOT DETECTED Final    RADIOLOGY:  Ct Abdomen Pelvis W Contrast  Result Date: 08/10/2016 CLINICAL DATA:  Acute onset of fever, headache and left lower quadrant abdominal pain. Initial encounter. EXAM: CT ABDOMEN AND PELVIS WITH CONTRAST TECHNIQUE: Multidetector CT imaging of the abdomen and pelvis was performed using the standard protocol following bolus administration of intravenous contrast. CONTRAST:  125mL ISOVUE-300 IOPAMIDOL (ISOVUE-300) INJECTION 61% COMPARISON:  None. FINDINGS: Lower chest: The visualized lung bases are grossly clear. The visualized portions of the mediastinum are unremarkable. Hepatobiliary: A 1.9 cm hypodensity is noted at the posterior aspect of the liver. The patient is status post cholecystectomy, with clips noted at the gallbladder fossa. The common bile duct remains within normal limits given prior cholecystectomy. Pancreas: The pancreas is within normal limits. Spleen: The spleen  is unremarkable in appearance. Adrenals/Urinary Tract: The adrenal glands are unremarkable in appearance. The kidneys are within normal limits. There is no evidence of hydronephrosis. No renal or ureteral stones are identified. No perinephric stranding is seen. Stomach/Bowel: The appendix is not visualized; there is no evidence of appendicitis. Contrast progresses to the level of the mid transverse colon. There is diffuse mucosal edema and soft tissue inflammation along the distal transverse and descending colon, concerning for infectious or inflammatory segmental colitis. There is no definite evidence of ischemia. The sigmoid colon is unremarkable in appearance. Small bowel loops are  unremarkable in appearance. The stomach is partially filled with contrast and within normal limits. Vascular/Lymphatic: Scattered calcification is seen along the abdominal aorta and its branches. The abdominal aorta is otherwise grossly unremarkable. The inferior vena cava is grossly unremarkable. No retroperitoneal lymphadenopathy is seen. No pelvic sidewall lymphadenopathy is identified. Reproductive: The bladder is mildly distended and grossly unremarkable in appearance. The patient is status post hysterectomy. No suspicious adnexal masses are seen. The ovaries are relatively symmetric in appearance. Other: No additional soft tissue abnormalities are seen. Musculoskeletal: No acute osseous abnormalities are identified. The visualized musculature is unremarkable in appearance. IMPRESSION: 1. Diffuse mucosal edema and soft tissue inflammation along the distal transverse and descending colon, concerning for segmental colitis, either infectious or inflammatory in nature. 2. Nonspecific 1.9 cm hypodensity at the posterior aspect of the liver. Would correlate with LFTs, and consider further evaluation if deemed clinically appropriate. 3. Scattered aortic atherosclerosis. Electronically Signed   By: Garald Balding M.D.   On: 08/10/2016  18:07     Management plans discussed with the patient, family and they are in agreement.  CODE STATUS:     Code Status Orders        Start     Ordered   08/10/16 2138  Full code  Continuous     08/10/16 2137    Code Status History    Date Active Date Inactive Code Status Order ID Comments User Context   This patient has a current code status but no historical code status.      TOTAL TIME TAKING CARE OF THIS PATIENT: 30 minutes.    Eloise Levels, PA student on 08/11/2016 at 2:19 PM  This is a Ship broker note, solely for education purposes. This note was reviewed with and approved by attending preceptor.   CC: Primary care physician; Verita Lamb, NP   Note: This dictation was prepared with Dragon dictation along with smaller phrase technology. Any transcriptional errors that result from this process are unintentional.

## 2016-08-11 NOTE — Progress Notes (Signed)
Patient discharge teaching given, including activity, diet, follow-up appoints, and medications. Patient verbalized understanding of all discharge instructions. IV access was d/c'd. Pt.'s belongings that were in locker 8 was given back to pt prior to discharge.Vitals are stable. Skin is intact except as charted in most recent assessments. Pt to be escorted out by NT.  Aldrich Lloyd [employer]

## 2016-08-11 NOTE — Discharge Summary (Signed)
[facility] [place] NAME: Yolanda Guerrero    MR#:  [id number]  DATE OF BIRTH:  1960/10/25  DATE OF ADMISSION:  08/10/2016   ADMITTING PHYSICIAN: Theodoro Grist, MD  DATE OF DISCHARGE: 08/11/2016  4:26 PM  PRIMARY CARE PHYSICIAN: Verita Lamb, NP   ADMISSION DIAGNOSIS:   Colitis [K52.9] Influenza A [J10.1]  DISCHARGE DIAGNOSIS:   Active Problems:   Gastrointestinal bleeding   Acute hemorrhagic colitis   Influenza A   Hypokalemia   SECONDARY DIAGNOSIS:   Past Medical History:  Diagnosis Date  . IBS (irritable bowel syndrome)     HOSPITAL COURSE:   56 year old female with past medical history significant for irritable bowel syndrome presents to the hospital secondary to abdominal pain, bloody stools.  #1 acute colitis-based on the CT scan, likely infectious colitis in the transverse and descending colon. -Started on Cipro and Flagyl. Tolerated soft diet well prior to discharge. -Discharge on oral antibiotics for total of 10 days. -Last colonoscopy about 5 years ago, normal. Due for repeat colonoscopy according to patient. -GI follow up as outpatient  #2 lower GI bleed-likely secondary to colitis. Less likely to be ischemic. No history of peripheral vascular disease or A. fib. -No further bleeding. Slight drop in hemoglobin is secondary to dilution. -Outpatient follow-up with GI. Last colonoscopy normal.  #3 influenza illness-continue Tamiflu to finish off the course.  Patient has been stable. Being discharged home today   DISCHARGE CONDITIONS:   Stable  CONSULTS OBTAINED:   None  DRUG ALLERGIES:   Allergies  Allergen Reactions  . Codeine Nausea And Vomiting  . Penicillins Hives    Has patient had a PCN reaction causing immediate rash, facial/tongue/throat swelling, SOB or lightheadedness with hypotension: No Has patient had a PCN reaction causing severe rash involving mucus membranes or skin necrosis:  No Has patient had a PCN reaction that required hospitalization: No Has patient had a PCN reaction occurring within the last 10 years: Yes If all of the above answers are "NO", then may proceed with Cephalosporin use.  Marland Kitchen Percocet [Oxycodone-Acetaminophen] Nausea And Vomiting  . Tramadol Nausea And Vomiting   DISCHARGE MEDICATIONS:   Allergies as of 08/11/2016      Reactions   Codeine Nausea And Vomiting   Penicillins Hives   Has patient had a PCN reaction causing immediate rash, facial/tongue/throat swelling, SOB or lightheadedness with hypotension: No Has patient had a PCN reaction causing severe rash involving mucus membranes or skin necrosis: No Has patient had a PCN reaction that required hospitalization: No Has patient had a PCN reaction occurring within the last 10 years: Yes If all of the above answers are "NO", then may proceed with Cephalosporin use.   Percocet [oxycodone-acetaminophen] Nausea And Vomiting   Tramadol Nausea And Vomiting      Medication List    STOP taking these medications   azithromycin 250 MG tablet Commonly known as:  ZITHROMAX     TAKE these medications   ciprofloxacin 500 MG tablet Commonly known as:  CIPRO Take 1 tablet (500 mg total) by mouth 2 (two) times daily. X 9 more days Notes to patient:  Last dose given 08/11/16 at 830   metroNIDAZOLE 500 MG tablet Commonly known as:  FLAGYL Take 1 tablet (500 mg total) by mouth 3 (three) times daily. X 9 more days Notes to patient:  Last dose given 08/11/16 @ 1:00pm   ondansetron 4 MG disintegrating tablet Commonly known as:  ZOFRAN-ODT  Take 4 mg by mouth every 8 (eight) hours as needed. Notes to patient:  Last dose given 08/11/16 @ 930   oseltamivir 75 MG capsule Commonly known as:  TAMIFLU Take 1 capsule (75 mg total) by mouth 2 (two) times daily. X 4 more days Notes to patient:  Last dose 08/11/16 at 930   PAZEO 0.7 % Soln Generic drug:  Olopatadine HCl Apply 1 drop to eye daily.         DISCHARGE INSTRUCTIONS:   1. PCP follow-up and once 2 weeks 2. GI follow up in 3 weeks for colonoscopy as outpatient  DIET:   Cardiac diet  ACTIVITY:   Activity as tolerated  OXYGEN:   Home Oxygen: No.  Oxygen Delivery: room air  DISCHARGE LOCATION:   home   If you experience worsening of your admission symptoms, develop shortness of breath, life threatening emergency, suicidal or homicidal thoughts you must seek medical attention immediately by calling 911 or calling your MD immediately  if symptoms less severe.  You Must read complete instructions/literature along with all the possible adverse reactions/side effects for all the Medicines you take and that have been prescribed to you. Take any new Medicines after you have completely understood and accpet all the possible adverse reactions/side effects.   Please note  You were cared for by a hospitalist during your hospital stay. If you have any questions about your discharge medications or the care you received while you were in the hospital after you are discharged, you can call the unit and asked to speak with the hospitalist on call if the hospitalist that took care of you is not available. Once you are discharged, your primary care physician will handle any further medical issues. Please note that NO REFILLS for any discharge medications will be authorized once you are discharged, as it is imperative that you return to your primary care physician (or establish a relationship with a primary care physician if you do not have one) for your aftercare needs so that they can reassess your need for medications and monitor your lab values.    On the day of Discharge:  VITAL SIGNS:   Blood pressure 125/74, pulse 92, temperature 98 F (36.7 C), temperature source Oral, resp. rate 18, height 5\' 3"  (1.6 m), weight 79.8 kg (176 lb), SpO2 99 %.  PHYSICAL EXAMINATION:    GENERAL:  56 y.o.-year-old patient lying in the bed with  no acute distress.  EYES: Pupils equal, round, reactive to light and accommodation. No scleral icterus. Extraocular muscles intact.  HEENT: Head atraumatic, normocephalic. Oropharynx and nasopharynx clear.  NECK:  Supple, no jugular venous distention. No thyroid enlargement, no tenderness.  LUNGS: Normal breath sounds bilaterally, no wheezing, rales,rhonchi or crepitation. No use of accessory muscles of respiration.  CARDIOVASCULAR: S1, S2 normal. No murmurs, rubs, or gallops.  ABDOMEN: Soft, non-tender, non-distended. Bowel sounds present. No organomegaly or mass.  EXTREMITIES: No pedal edema, cyanosis, or clubbing.  NEUROLOGIC: Cranial nerves II through XII are intact. Muscle strength 5/5 in all extremities. Sensation intact. Gait not checked.  PSYCHIATRIC: The patient is alert and oriented x 3.  SKIN: No obvious rash, lesion, or ulcer.   DATA REVIEW:   CBC  Recent Labs Lab 08/11/16 0445 08/11/16 1211  WBC 4.7  --   HGB 13.0 14.0  HCT 37.2  --   PLT 214  --     Chemistries   Recent Labs Lab 08/10/16 1145 08/11/16 0445  NA 138 138  K 3.3* 3.3*  CL 102 105  CO2 28 26  GLUCOSE 99 139*  BUN 18 12  CREATININE 0.80 0.65  CALCIUM 9.0 8.0*  MG 2.0  --   AST 35  --   ALT 32  --   ALKPHOS 80  --   BILITOT 0.6  --      Microbiology Results  Results for orders placed or performed during the hospital encounter of 08/10/16  C difficile quick scan w PCR reflex     Status: None   Collection Time: 08/11/16 12:25 AM  Result Value Ref Range Status   C Diff antigen NEGATIVE NEGATIVE Final   C Diff toxin NEGATIVE NEGATIVE Final   C Diff interpretation No C. difficile detected.  Final  Gastrointestinal Panel by PCR , Stool     Status: None   Collection Time: 08/11/16 12:25 AM  Result Value Ref Range Status   Campylobacter species NOT DETECTED NOT DETECTED Final   Plesimonas shigelloides NOT DETECTED NOT DETECTED Final   Salmonella species NOT DETECTED NOT DETECTED Final    Yersinia enterocolitica NOT DETECTED NOT DETECTED Final   Vibrio species NOT DETECTED NOT DETECTED Final   Vibrio cholerae NOT DETECTED NOT DETECTED Final   Enteroaggregative E coli (EAEC) NOT DETECTED NOT DETECTED Final   Enteropathogenic E coli (EPEC) NOT DETECTED NOT DETECTED Final   Enterotoxigenic E coli (ETEC) NOT DETECTED NOT DETECTED Final   Shiga like toxin producing E coli (STEC) NOT DETECTED NOT DETECTED Final   Shigella/Enteroinvasive E coli (EIEC) NOT DETECTED NOT DETECTED Final   Cryptosporidium NOT DETECTED NOT DETECTED Final   Cyclospora cayetanensis NOT DETECTED NOT DETECTED Final   Entamoeba histolytica NOT DETECTED NOT DETECTED Final   Giardia lamblia NOT DETECTED NOT DETECTED Final   Adenovirus F40/41 NOT DETECTED NOT DETECTED Final   Astrovirus NOT DETECTED NOT DETECTED Final   Norovirus GI/GII NOT DETECTED NOT DETECTED Final   Rotavirus A NOT DETECTED NOT DETECTED Final   Sapovirus (I, II, IV, and V) NOT DETECTED NOT DETECTED Final    RADIOLOGY:  Ct Abdomen Pelvis W Contrast  Result Date: 08/10/2016 CLINICAL DATA:  Acute onset of fever, headache and left lower quadrant abdominal pain. Initial encounter. EXAM: CT ABDOMEN AND PELVIS WITH CONTRAST TECHNIQUE: Multidetector CT imaging of the abdomen and pelvis was performed using the standard protocol following bolus administration of intravenous contrast. CONTRAST:  139mL ISOVUE-300 IOPAMIDOL (ISOVUE-300) INJECTION 61% COMPARISON:  None. FINDINGS: Lower chest: The visualized lung bases are grossly clear. The visualized portions of the mediastinum are unremarkable. Hepatobiliary: A 1.9 cm hypodensity is noted at the posterior aspect of the liver. The patient is status post cholecystectomy, with clips noted at the gallbladder fossa. The common bile duct remains within normal limits given prior cholecystectomy. Pancreas: The pancreas is within normal limits. Spleen: The spleen is unremarkable in appearance. Adrenals/Urinary  Tract: The adrenal glands are unremarkable in appearance. The kidneys are within normal limits. There is no evidence of hydronephrosis. No renal or ureteral stones are identified. No perinephric stranding is seen. Stomach/Bowel: The appendix is not visualized; there is no evidence of appendicitis. Contrast progresses to the level of the mid transverse colon. There is diffuse mucosal edema and soft tissue inflammation along the distal transverse and descending colon, concerning for infectious or inflammatory segmental colitis. There is no definite evidence of ischemia. The sigmoid colon is unremarkable in appearance. Small bowel loops are unremarkable in appearance. The stomach is partially filled with contrast and within  normal limits. Vascular/Lymphatic: Scattered calcification is seen along the abdominal aorta and its branches. The abdominal aorta is otherwise grossly unremarkable. The inferior vena cava is grossly unremarkable. No retroperitoneal lymphadenopathy is seen. No pelvic sidewall lymphadenopathy is identified. Reproductive: The bladder is mildly distended and grossly unremarkable in appearance. The patient is status post hysterectomy. No suspicious adnexal masses are seen. The ovaries are relatively symmetric in appearance. Other: No additional soft tissue abnormalities are seen. Musculoskeletal: No acute osseous abnormalities are identified. The visualized musculature is unremarkable in appearance. IMPRESSION: 1. Diffuse mucosal edema and soft tissue inflammation along the distal transverse and descending colon, concerning for segmental colitis, either infectious or inflammatory in nature. 2. Nonspecific 1.9 cm hypodensity at the posterior aspect of the liver. Would correlate with LFTs, and consider further evaluation if deemed clinically appropriate. 3. Scattered aortic atherosclerosis. Electronically Signed   By: Garald Balding M.D.   On: 08/10/2016 18:07     Management plans discussed with the  patient, family and they are in agreement.  CODE STATUS:     Code Status Orders        Start     Ordered   08/10/16 2138  Full code  Continuous     08/10/16 2137    Code Status History    Date Active Date Inactive Code Status Order ID Comments User Context   This patient has a current code status but no historical code status.      TOTAL TIME TAKING CARE OF THIS PATIENT: 37 minutes.    Gladstone Lighter M.D on 08/11/2016 at 5:18 PM  Between 7am to 6pm - Pager - [phone]  After 6pm go to [email or web address] - Proofreader  [facility]  Office  [phone]  CC: Primary care physician; Verita Lamb, NP   Note: This dictation was prepared with Dragon dictation along with smaller phrase technology. Any transcriptional errors that result from this process are unintentional.

## 2016-08-16 ENCOUNTER — Other Ambulatory Visit: Payer: Self-pay | Admitting: *Deleted

## 2016-08-25 ENCOUNTER — Ambulatory Visit: Payer: Self-pay | Admitting: Gastroenterology

## 2016-09-23 DIAGNOSIS — K625 Hemorrhage of anus and rectum: Secondary | ICD-10-CM | POA: Insufficient documentation

## 2016-09-28 ENCOUNTER — Other Ambulatory Visit: Payer: Self-pay | Admitting: Gastroenterology

## 2016-09-28 DIAGNOSIS — R935 Abnormal findings on diagnostic imaging of other abdominal regions, including retroperitoneum: Secondary | ICD-10-CM

## 2016-09-28 DIAGNOSIS — K769 Liver disease, unspecified: Secondary | ICD-10-CM

## 2016-10-09 ENCOUNTER — Ambulatory Visit
Admission: RE | Admit: 2016-10-09 | Discharge: 2016-10-09 | Disposition: A | Discharge status: Home / Self Care | Payer: BLUE CROSS/BLUE SHIELD | Source: Ambulatory Visit | Attending: Gastroenterology | Admitting: Gastroenterology

## 2016-10-09 DIAGNOSIS — K7689 Other specified diseases of liver: Secondary | ICD-10-CM | POA: Insufficient documentation

## 2016-10-09 DIAGNOSIS — R935 Abnormal findings on diagnostic imaging of other abdominal regions, including retroperitoneum: Secondary | ICD-10-CM | POA: Diagnosis present

## 2016-10-09 DIAGNOSIS — K769 Liver disease, unspecified: Secondary | ICD-10-CM | POA: Diagnosis present

## 2016-10-09 DIAGNOSIS — K760 Fatty (change of) liver, not elsewhere classified: Secondary | ICD-10-CM | POA: Insufficient documentation

## 2016-10-09 MED ORDER — GADOBENATE DIMEGLUMINE 529 MG/ML IV SOLN
20.0000 mL | Freq: Once | INTRAVENOUS | Status: AC | PRN
  Administered 2016-10-09: 16 mL via INTRAVENOUS

## 2017-01-20 ENCOUNTER — Encounter: Payer: Self-pay | Admitting: *Deleted

## 2017-01-21 ENCOUNTER — Ambulatory Visit
Admission: RE | Admit: 2017-01-21 | Discharge: 2017-01-21 | Disposition: A | Discharge status: Home / Self Care | Payer: BLUE CROSS/BLUE SHIELD | Source: Ambulatory Visit | Attending: Gastroenterology | Admitting: Gastroenterology

## 2017-01-21 ENCOUNTER — Encounter: Payer: Self-pay | Admitting: *Deleted

## 2017-01-21 ENCOUNTER — Ambulatory Visit: Payer: BLUE CROSS/BLUE SHIELD | Admitting: Anesthesiology

## 2017-01-21 ENCOUNTER — Encounter
Admission: RE | Disposition: A | Discharge status: Home / Self Care | Payer: Self-pay | Source: Ambulatory Visit | Attending: Gastroenterology

## 2017-01-21 DIAGNOSIS — K219 Gastro-esophageal reflux disease without esophagitis: Secondary | ICD-10-CM | POA: Diagnosis present

## 2017-01-21 DIAGNOSIS — Z880 Allergy status to penicillin: Secondary | ICD-10-CM | POA: Diagnosis not present

## 2017-01-21 DIAGNOSIS — Z87891 Personal history of nicotine dependence: Secondary | ICD-10-CM | POA: Diagnosis not present

## 2017-01-21 DIAGNOSIS — K2970 Gastritis, unspecified, without bleeding: Secondary | ICD-10-CM | POA: Insufficient documentation

## 2017-01-21 DIAGNOSIS — K625 Hemorrhage of anus and rectum: Secondary | ICD-10-CM | POA: Insufficient documentation

## 2017-01-21 DIAGNOSIS — K640 First degree hemorrhoids: Secondary | ICD-10-CM | POA: Diagnosis not present

## 2017-01-21 DIAGNOSIS — K3189 Other diseases of stomach and duodenum: Secondary | ICD-10-CM | POA: Insufficient documentation

## 2017-01-21 DIAGNOSIS — R933 Abnormal findings on diagnostic imaging of other parts of digestive tract: Secondary | ICD-10-CM | POA: Diagnosis not present

## 2017-01-21 DIAGNOSIS — K589 Irritable bowel syndrome without diarrhea: Secondary | ICD-10-CM | POA: Insufficient documentation

## 2017-01-21 DIAGNOSIS — E079 Disorder of thyroid, unspecified: Secondary | ICD-10-CM | POA: Insufficient documentation

## 2017-01-21 DIAGNOSIS — Z885 Allergy status to narcotic agent status: Secondary | ICD-10-CM | POA: Insufficient documentation

## 2017-01-21 DIAGNOSIS — K210 Gastro-esophageal reflux disease with esophagitis: Secondary | ICD-10-CM | POA: Insufficient documentation

## 2017-01-21 HISTORY — PX: COLONOSCOPY WITH PROPOFOL: SHX5780

## 2017-01-21 HISTORY — DX: Disorder of thyroid, unspecified: E07.9

## 2017-01-21 HISTORY — DX: Unspecified osteoarthritis, unspecified site: M19.90

## 2017-01-21 HISTORY — PX: ESOPHAGOGASTRODUODENOSCOPY (EGD) WITH PROPOFOL: SHX5813

## 2017-01-21 SURGERY — ESOPHAGOGASTRODUODENOSCOPY (EGD) WITH PROPOFOL
Anesthesia: General

## 2017-01-21 MED ORDER — FENTANYL CITRATE (PF) 100 MCG/2ML IJ SOLN
INTRAMUSCULAR | Status: DC | PRN
  Administered 2017-01-21: 50 ug via INTRAVENOUS

## 2017-01-21 MED ORDER — FENTANYL CITRATE (PF) 100 MCG/2ML IJ SOLN
INTRAMUSCULAR | Status: AC
  Filled 2017-01-21: qty 2

## 2017-01-21 MED ORDER — PROPOFOL 10 MG/ML IV BOLUS
INTRAVENOUS | Status: DC | PRN
  Administered 2017-01-21: 45 mg via INTRAVENOUS

## 2017-01-21 MED ORDER — MIDAZOLAM HCL 2 MG/2ML IJ SOLN
INTRAMUSCULAR | Status: DC | PRN
  Administered 2017-01-21: 2 mg via INTRAVENOUS

## 2017-01-21 MED ORDER — PROPOFOL 10 MG/ML IV BOLUS
INTRAVENOUS | Status: AC
  Filled 2017-01-21: qty 20

## 2017-01-21 MED ORDER — SODIUM CHLORIDE 0.9 % IV SOLN: INTRAVENOUS | Status: DC

## 2017-01-21 MED ORDER — PROPOFOL 500 MG/50ML IV EMUL
INTRAVENOUS | Status: DC | PRN
  Administered 2017-01-21: 150 ug/kg/min via INTRAVENOUS

## 2017-01-21 MED ORDER — SODIUM CHLORIDE 0.9 % IV SOLN
INTRAVENOUS | Status: DC
  Administered 2017-01-21 (×2): via INTRAVENOUS

## 2017-01-21 MED ORDER — MIDAZOLAM HCL 2 MG/2ML IJ SOLN
INTRAMUSCULAR | Status: AC
  Filled 2017-01-21: qty 2

## 2017-01-21 MED ORDER — LIDOCAINE HCL (CARDIAC) 20 MG/ML IV SOLN
INTRAVENOUS | Status: DC | PRN
  Administered 2017-01-21: 100 mg via INTRAVENOUS

## 2017-01-21 NOTE — Anesthesia Postprocedure Evaluation (Signed)
Anesthesia Post Note  Patient: Yolanda Guerrero  Procedure(s) Performed: Procedure(s) (LRB): ESOPHAGOGASTRODUODENOSCOPY (EGD) WITH PROPOFOL (N/A) COLONOSCOPY WITH PROPOFOL (N/A)  Patient location during evaluation: Endoscopy Anesthesia Type: General Level of consciousness: awake and alert Pain management: pain level controlled Vital Signs Assessment: post-procedure vital signs reviewed and stable Respiratory status: spontaneous breathing and respiratory function stable Cardiovascular status: stable Anesthetic complications: no     Last Vitals:  Vitals:   01/21/17 1250 01/21/17 1300  BP: (!) 127/93 (!) 130/92  Pulse: 94 84  Resp: (!) 22 14  Temp:      Last Pain:  Vitals:   01/21/17 1229  TempSrc: Tympanic                 Rekha Hobbins K

## 2017-01-21 NOTE — Anesthesia Procedure Notes (Addendum)
Date/Time: 01/21/2017 11:35 AM Performed by: Allean Found Pre-anesthesia Checklist: Patient identified, Suction available, Emergency Drugs available, Patient being monitored and Timeout performed Oxygen Delivery Method: Nasal cannula Placement Confirmation: positive ETCO2

## 2017-01-21 NOTE — Op Note (Signed)
[facility] Gastroenterology Patient Name: Yolanda Guerrero Procedure Date: 01/21/2017 11:35 AM MRN: [id number] Account #: [id number] Date of Birth: 10/18/1960 Admit Type: Outpatient Age: 56 Room: [facility] ENDO ROOM 1 Gender: Female Note Status: Finalized Procedure:            Upper GI endoscopy Indications:          Epigastric abdominal pain Providers:            Lollie Sails, MD Referring MD:         Warren Danes (Referring MD) Medicines:            Monitored Anesthesia Care Complications:        No immediate complications. Procedure:            Pre-Anesthesia Assessment:                       - ASA Grade Assessment: II - A patient with mild                        systemic disease.                       After obtaining informed consent, the endoscope was                        passed under direct vision. Throughout the procedure,                        the patient's blood pressure, pulse, and oxygen                        saturations were monitored continuously. The Endoscope                        was introduced through the mouth, and advanced to the                        third part of duodenum. The upper GI endoscopy was                        accomplished without difficulty. The patient tolerated                        the procedure well. Findings:      The examined esophagus was normal. minimal irritation at the GE       junction. Biopsies were taken with a cold forceps for histology.      The exam of the esophagus was otherwise normal.      Patchy minimal inflammation characterized by erythema was found in the       gastric body. Biopsies were taken with a cold forceps for histology.      The cardia and gastric fundus were normal on retroflexion.      Diffuse mild mucosal variance characterized by smoothness was found in       the entire duodenum. Biopsies were taken with a cold forceps for       histology. Impression:           - Normal esophagus.  Biopsied.                       -  Gastritis. Biopsied.                       - Mucosal variant in the duodenum. Biopsied. Recommendation:       - Use Prilosec (omeprazole) 20 mg PO daily for 2 months.                       - Return to GI clinic in 5 weeks.                       - Await pathology results. Procedure Code(s):    --- Professional ---                       [id number], Esophagogastroduodenoscopy, flexible, transoral;                        with biopsy, single or multiple Diagnosis Code(s):    --- Professional ---                       K29.70, Gastritis, unspecified, without bleeding                       K31.89, Other diseases of stomach and duodenum                       R10.13, Epigastric pain CPT copyright 2016 American Medical Association. All rights reserved. The codes documented in this report are preliminary and upon coder review may  be revised to meet current compliance requirements. Lollie Sails, MD 01/21/2017 11:56:58 AM This report has been signed electronically. Number of Addenda: 0 Note Initiated On: 01/21/2017 11:35 AM      [facility]

## 2017-01-21 NOTE — Anesthesia Procedure Notes (Signed)
Performed by: Banks Chaikin       

## 2017-01-21 NOTE — Op Note (Signed)
[facility] Gastroenterology Patient Name: Female Iafrate Procedure Date: 01/21/2017 11:35 AM MRN: [id number] Account #: [id number] Date of Birth: 1961-01-16 Admit Type: Outpatient Age: 56 Room: [facility] ENDO ROOM 1 Gender: Female Note Status: Finalized Procedure:            Colonoscopy Indications:          Rectal bleeding, Abnormal CT of the GI tract Providers:            Lollie Sails, MD Referring MD:         Warren Danes (Referring MD) Medicines:            Monitored Anesthesia Care Complications:        No immediate complications. Procedure:            Pre-Anesthesia Assessment:                       - ASA Grade Assessment: II - A patient with mild                        systemic disease.                       After obtaining informed consent, the colonoscope was                        passed under direct vision. Throughout the procedure,                        the patient's blood pressure, pulse, and oxygen                        saturations were monitored continuously. The                        Colonoscope was introduced through the anus and                        advanced to the the cecum, identified by appendiceal                        orifice and ileocecal valve. The quality of the bowel                        preparation was good. Findings:      Non-bleeding internal hemorrhoids were found during anoscopy. The       hemorrhoids were small and Grade I (internal hemorrhoids that do not       prolapse).      The exam was otherwise without abnormality.      The colon (entire examined portion) appeared normal. Biopsies for       histology were taken with a cold forceps from the right colon and left       colon for evaluation of microscopic colitis.      The digital rectal exam was normal.      The ileocecal valve was turned open and noted to be normal. Impression:           - Non-bleeding internal hemorrhoids.                       - The  examination was otherwise normal.                       -  The entire examined colon is normal. Biopsied. Recommendation:       - Discharge patient to home.                       - Await pathology results.                       - Await pathology results.                       - Return to GI clinic in 3 weeks. Procedure Code(s):    --- Professional ---                       [id number], Colonoscopy, flexible; with biopsy, single or                        multiple Diagnosis Code(s):    --- Professional ---                       K64.0, First degree hemorrhoids                       K62.5, Hemorrhage of anus and rectum                       R93.3, Abnormal findings on diagnostic imaging of other                        parts of digestive tract CPT copyright 2016 American Medical Association. All rights reserved. The codes documented in this report are preliminary and upon coder review may  be revised to meet current compliance requirements. Lollie Sails, MD 01/21/2017 12:26:50 PM This report has been signed electronically. Number of Addenda: 0 Note Initiated On: 01/21/2017 11:35 AM Scope Withdrawal Time: 0 hours 9 minutes 11 seconds  Total Procedure Duration: 0 hours 22 minutes 1 second       [facility]

## 2017-01-21 NOTE — Transfer of Care (Signed)
Immediate Anesthesia Transfer of Care Note  Patient: Yolanda Guerrero  Procedure(s) Performed: Procedure(s): ESOPHAGOGASTRODUODENOSCOPY (EGD) WITH PROPOFOL (N/A) COLONOSCOPY WITH PROPOFOL (N/A)  Patient Location: PACU  Anesthesia Type:General  Level of Consciousness: awake  Airway & Oxygen Therapy: Patient Spontanous Breathing and Patient connected to nasal cannula oxygen  Post-op Assessment: Report given to RN and Post -op Vital signs reviewed and stable  Post vital signs: Reviewed and stable  Last Vitals:  Vitals:   01/21/17 0948 01/21/17 1229  BP: (!) 148/90   Pulse: 83   Resp: 18 15  Temp: (!) 36.1 C (!) 36.4 C    Last Pain:  Vitals:   01/21/17 1229  TempSrc: Tympanic         Complications: No apparent anesthesia complications

## 2017-01-21 NOTE — Anesthesia Preprocedure Evaluation (Signed)
Anesthesia Evaluation  Patient identified by MRN, date of birth, ID band Patient awake    Reviewed: Allergy & Precautions, NPO status , Patient's Chart, lab work & pertinent test results  History of Anesthesia Complications (+) PONV  Airway Mallampati: II       Dental   Pulmonary neg COPD, former smoker,           Cardiovascular (-) hypertension(-) Past MI and (-) CHF + Valvular Problems/Murmurs (murmur, no tx)      Neuro/Psych neg Seizures    GI/Hepatic Neg liver ROS, neg GERD  ,  Endo/Other  neg diabetes  Renal/GU negative Renal ROS     Musculoskeletal   Abdominal   Peds  Hematology   Anesthesia Other Findings   Reproductive/Obstetrics                             Anesthesia Physical Anesthesia Plan  ASA: II  Anesthesia Plan: General   Post-op Pain Management:    Induction: Intravenous  PONV Risk Score and Plan:   Airway Management Planned: Nasal Cannula  Additional Equipment:   Intra-op Plan:   Post-operative Plan:   Informed Consent: I have reviewed the patients History and Physical, chart, labs and discussed the procedure including the risks, benefits and alternatives for the proposed anesthesia with the patient or authorized representative who has indicated his/her understanding and acceptance.     Plan Discussed with:   Anesthesia Plan Comments:         Anesthesia Quick Evaluation

## 2017-01-21 NOTE — Anesthesia Post-op Follow-up Note (Cosign Needed)
Anesthesia QCDR form completed.        

## 2017-01-21 NOTE — H&P (Signed)
Outpatient short stay form Pre-procedure 01/21/2017 11:23 AM Yolanda Sails MD  Primary Physician: Dr. Sherryle Lis  Reason for visit:  EGD and colonoscopy  History of present illness:  Patient is a 56 year old female presenting today as above. She has been having some issues with epigastric pain sometimes some reflux issues for which she takes Tums. Seems at times to radiate to her back. This is burning in nature. She does have a history of IBS. There is no nausea or vomiting. There is no dysphagia. She had a contrasted CT scan of the abdomen and pelvis with some mild diffuse left colon thickening and it edema noted. However this may have been artifact. Her last colonoscopy was in 2014 noted be normal. She tolerated her prep well. She takes no aspirin or blood thinning agents.    Current Facility-Administered Medications:  .  0.9 %  sodium chloride infusion, , Intravenous, Continuous, Yolanda Sails, MD, Last Rate: 20 mL/hr at 01/21/17 1005 .  0.9 %  sodium chloride infusion, , Intravenous, Continuous, Yolanda Sails, MD  Prescriptions Prior to Admission  Medication Sig Dispense Refill Last Dose  . fluticasone (FLONASE) 50 MCG/ACT nasal spray Place 2 sprays into both nostrils daily.   Past Week at Unknown time  . sodium chloride (OCEAN) 0.65 % SOLN nasal spray Place 2 sprays into both nostrils as needed for congestion.   01/20/2017 at Unknown time  . Sodium Chloride-Sodium Bicarb 1685-515 MG PACK Place into the nose.        Allergies  Allergen Reactions  . Levaquin [Levofloxacin In D5w] Other (See Comments)    Heart Flutter  . Codeine Nausea And Vomiting  . Fexofenadine Hcl Nausea Only  . Penicillins Hives    Has patient had a PCN reaction causing immediate rash, facial/tongue/throat swelling, SOB or lightheadedness with hypotension: No Has patient had a PCN reaction causing severe rash involving mucus membranes or skin necrosis: No Has patient had a PCN reaction that required  hospitalization: No Has patient had a PCN reaction occurring within the last 10 years: Yes If all of the above answers are "NO", then may proceed with Cephalosporin use.  Marland Kitchen Percocet [Oxycodone-Acetaminophen] Nausea And Vomiting  . Tramadol Nausea And Vomiting     Past Medical History:  Diagnosis Date  . Arthritis   . IBS (irritable bowel syndrome)   . Thyroid disease     Review of systems:      Physical Exam    Heart and lungs: Regular rate and rhythm without rub or gallop, lungs are bilaterally clear.    HEENT: Normocephalic atraumatic eyes are anicteric    Other:     Pertinant exam for procedure: Soft nontender nondistended bowel sounds positive normoactive. I have discussed the risks benefits and complications of procedures to include not limited to bleeding, infection, perforation and the risk of sedation and the patient wishes to proceed.    Planned proceedures: EGD, colonoscopy and indicated procedures. I have discussed the risks benefits and complications of procedures to include not limited to bleeding, infection, perforation and the risk of sedation and the patient wishes to proceed.    Yolanda Sails, MD Gastroenterology 01/21/2017  11:23 AM

## 2017-01-24 ENCOUNTER — Encounter: Payer: Self-pay | Admitting: Gastroenterology

## 2017-01-24 LAB — SURGICAL PATHOLOGY

## 2019-10-16 ENCOUNTER — Other Ambulatory Visit: Payer: Self-pay

## 2019-10-16 ENCOUNTER — Encounter: Payer: Self-pay | Admitting: Dermatology

## 2019-10-16 ENCOUNTER — Ambulatory Visit: Payer: Managed Care, Other (non HMO) | Admitting: Dermatology

## 2019-10-16 DIAGNOSIS — L814 Other melanin hyperpigmentation: Secondary | ICD-10-CM

## 2019-10-16 DIAGNOSIS — D485 Neoplasm of uncertain behavior of skin: Secondary | ICD-10-CM | POA: Diagnosis not present

## 2019-10-16 DIAGNOSIS — Z1283 Encounter for screening for malignant neoplasm of skin: Secondary | ICD-10-CM | POA: Diagnosis not present

## 2019-10-16 DIAGNOSIS — L821 Other seborrheic keratosis: Secondary | ICD-10-CM

## 2019-10-16 DIAGNOSIS — D489 Neoplasm of uncertain behavior, unspecified: Secondary | ICD-10-CM

## 2019-10-16 DIAGNOSIS — L578 Other skin changes due to chronic exposure to nonionizing radiation: Secondary | ICD-10-CM

## 2019-10-16 DIAGNOSIS — D1800 Hemangioma unspecified site: Secondary | ICD-10-CM

## 2019-10-16 NOTE — Addendum Note (Signed)
Addended by: Alfonso Patten on: 10/16/2019 08:18 PM   Modules accepted: Level of Service

## 2019-10-16 NOTE — Progress Notes (Addendum)
New Patient Visit  Subjective  Yolanda Guerrero is a 59 y.o. female who presents for the following: Nevus (noticed about 6 months ago, patients states that they are black looking, B/L Axillae) and Skin Tag (under left arm and in groin area that have been there for years,Left under arm gets irritated when shaving.).  No personal hx of skin cancers  The following portions of the chart were reviewed this encounter and updated as appropriate: medications, allergies, past medical history, social history  Review of Systems:  No other skin or systemic complaints except as noted in HPI or Assessment and Plan.   Objective  Well appearing patient in no apparent distress; mood and affect are within normal limits.  A full examination was performed including scalp, head, eyes, ears, nose, lips, neck, chest, axillae, abdomen, back, buttocks, bilateral upper extremities, bilateral lower extremities, hands, feet, fingers, toes, fingernails, and toenails. All findings within normal limits unless otherwise noted below.  Objective  Left Axilla: 0.6 cm pedunculated erythematous papule  Objective  Right Medial thigh: 0.9 cm erythematous fleshy papule           Objective  Right Medial Thigh: Patient has 2 erythematous skin colored papules  Objective  Left Medial Thigh: Patient has 4 erythematous skin colored papules  Assessment & Plan  Neoplasm of uncertain behavior (4) Left Axilla  Epidermal / dermal shaving  Lesion length (cm):  0.2 Lesion width (cm):  0.2 Margin per side (cm):  0.1 Total excision diameter (cm):  0.4 Informed consent: discussed and consent obtained   Timeout: patient name, date of birth, surgical site, and procedure verified   Anesthesia: the lesion was anesthetized in a standard fashion   Anesthetic:  1% lidocaine w/ epinephrine 1-100,000 buffered w/ 8.4% NaHCO3 Instrument used: flexible razor blade   Outcome: patient tolerated procedure well   Post-procedure  details: sterile dressing applied and wound care instructions given   Dressing type: petrolatum and pressure dressing    Specimen 1 - Surgical pathology Differential Diagnosis: R/o Irritated seborrheic Keratoses vs achrochordon vs other Left Axilla Check Margins: No 0.6cm pedunculated erythematous papule  Right Medial thigh  Epidermal / dermal shaving  Lesion length (cm):  0.9 Lesion width (cm):  0.9 Margin per side (cm):  0.1 Total excision diameter (cm):  1.1 Informed consent: discussed and consent obtained   Timeout: patient name, date of birth, surgical site, and procedure verified   Anesthesia: the lesion was anesthetized in a standard fashion   Anesthetic:  1% lidocaine w/ epinephrine 1-100,000 buffered w/ 8.4% NaHCO3 Instrument used: flexible razor blade   Outcome: patient tolerated procedure well   Post-procedure details: sterile dressing applied and wound care instructions given   Dressing type: petrolatum and pressure dressing    Specimen 2 - Surgical pathology Differential Diagnosis: R/O nevus vs lipofibroma vs neurofibroma vs acrochordon Right medial thigh Check Margins: No 0.9cm erythematous fleshy papule  Right Medial Thigh  Left Medial Thigh  R/O irritated seborrheic keratosis vs achrochordon vs other  Left Axilla R/O nevus vs lipofibroma vs neurofibroma vs acrochordon Right Medial Thigh  Other papules at inner thighs plan shave removal another day R/O irritated acrochordon vs nevus vs neurofibroma   Lentigines - Scattered tan macules - Discussed due to sun exposure - Benign, observe - Call for any changes  Actinic Damage - diffuse scaly erythematous macules with underlying dyspigmentation - Recommend daily broad spectrum sunscreen SPF 30+ to sun-exposed areas, reapply every 2 hours as needed.  - Call  for new or changing lesions.  Seborrheic Keratoses   - Stuck-on, waxy, tan-brown papules and plaques  - Discussed benign etiology and prognosis. -  Observe - Call for any changes Scattered,  right axilla   Hemangiomas - Red papules - Discussed benign nature - Observe - Call for any changes  Skin cancer screening performed today.    Return in 1 year (on 10/15/2020) for next available for shave removal, TBSE in 1 year.   IDonzetta Kohut, CMA, am acting as scribe for Forest Gleason, MD .  Documentation: I have reviewed the above documentation for accuracy and completeness, and I agree with the above.  Forest Gleason, MD

## 2019-10-16 NOTE — Patient Instructions (Addendum)
Recommend daily broad spectrum sunscreen SPF 30+ to sun-exposed areas, reapply every 2 hours as needed. Call for new or changing lesions.  Shave Excision Benign Lesion Wound Care Instructions  . Leave the original bandage on for 24 hours if possible.  If the bandage becomes soaked or soiled before that time, it is OK to remove it and examine the wound.  A small amount of post-operative bleeding is normal.  If excessive bleeding occurs, remove the bandage, place gauze over the site and apply continuous pressure (no peeking) over the area for 20-30 minutes.  If this does not stop the bleeding, try again for 40 minutes.  If this does not work, please call our clinic as soon as possible (even if after-hours).    . Twice a day, cleanse the wound with soap and water.  If a thick crust develops you may use a Q-tip dipped into dilute hydrogen peroxide (mix 1:1 with water) to dissolve it.  Hydrogen peroxide can slow the healing process, so use it only as needed.  After washing, apply Vaseline jelly or Polysporin ointment.  For best healing, the wound should be covered with a layer of ointment at all times.  This may mean re-applying the ointment several times a day.  For open wounds, continue until it has healed.    . If you have any swelling, keep the area elevated.  . Some redness, tenderness and white or yellow material in the wound is normal healing.  If the area becomes very sore and red, or develops a thick yellow-green material (pus), it may be infected; please notify us.    . Wound healing continues for up to one year following surgery.  It is not unusual to experience pain in the scar from time to time during the interval.  If the pain becomes severe or the scar thickens, you should notify the office.  A slight amount of redness in a scar is expected for the first six months.  After six months, the redness subsides and the scar will soften and fade.  The color difference becomes less noticeable with  time.  If there are any problems, return for a post-op surgery check at your earliest convenience.  . Please call our office for any questions or concerns.    

## 2019-10-17 ENCOUNTER — Ambulatory Visit (INDEPENDENT_AMBULATORY_CARE_PROVIDER_SITE_OTHER): Payer: Managed Care, Other (non HMO) | Admitting: Dermatology

## 2019-10-17 DIAGNOSIS — D485 Neoplasm of uncertain behavior of skin: Secondary | ICD-10-CM

## 2019-10-17 MED ORDER — MUPIROCIN 2 % EX OINT: 1.0000 "application " | TOPICAL_OINTMENT | Freq: Two times a day (BID) | CUTANEOUS | 0 refills | Status: DC

## 2019-10-17 NOTE — Progress Notes (Signed)
1. Skin , left axilla [place]  Skin tag, benign, no additional treatment needed.   2. Skin , right medial buttock [place]  Skin tag, benign, no additional treatment needed.

## 2019-10-17 NOTE — Patient Instructions (Signed)

## 2019-10-17 NOTE — Progress Notes (Signed)
Follow-Up Visit   Subjective  Yolanda Guerrero is a 58 y.o. female who presents for the following: Procedure (here today for biopsies).  Multiple spots inner thighs, rubbing and irritated.  The following portions of the chart were reviewed this encounter and updated as appropriate: Tobacco  Allergies  Meds  Problems  Med Hx  Surg Hx  Fam Hx      Review of Systems: No other skin or systemic complaints.  Objective  Well appearing patient in no apparent distress; mood and affect are within normal limits.  A focused examination was performed including lower extremities, including the legs, feet, toes, and toenails and legs. Relevant physical exam findings are noted in the Assessment and Plan.  Objective  Left Medial Thigh F: Erythematous skin colored papule 0.4cm     Objective  Left Medial Thigh E: Erythematous skin colored papule 0.3cm     Objective  Left Medial Thigh D: Erythematous skin colored papule 0.3cm     Objective  Left Medial Thigh C: Erythematous skin colored papule 0.2cm     Objective  Right Medial Thigh B: Erythematous skin colored papule 0.4cm     Objective  Right Medial Thigh A: Erythematous skin colored papule 0.3cm     Assessment & Plan  Neoplasm of uncertain behavior of skin (6) Left Medial Thigh F  Epidermal / dermal shaving  Lesion length (cm):  0.4 Lesion width (cm):  0.4 Margin per side (cm):  0.1 Total excision diameter (cm):  0.6 Informed consent: discussed and consent obtained   Timeout: patient name, date of birth, surgical site, and procedure verified   Patient was prepped and draped in usual sterile fashion: area prepped with isopropyl alcohol. Anesthesia: the lesion was anesthetized in a standard fashion   Anesthetic:  1% lidocaine w/ epinephrine 1-100,000 buffered w/ 8.4% NaHCO3 Instrument used: flexible razor blade   Hemostasis achieved with: aluminum chloride   Outcome: patient tolerated procedure well     Post-procedure details: wound care instructions given   Additional details:  Mupirocin and a bandage applied  mupirocin ointment (BACTROBAN) 2 %  Specimen 1 - Surgical pathology Differential Diagnosis: r/o Irritated Acrochordon vs Nevus vs Neurofibroma Check Margins: No Erythematous skin colored papule 0.4cm  Left Medial Thigh E  Epidermal / dermal shaving  Lesion length (cm):  0.3 Lesion width (cm):  0.3 Margin per side (cm):  0.1 Total excision diameter (cm):  0.5 Informed consent: discussed and consent obtained   Timeout: patient name, date of birth, surgical site, and procedure verified   Patient was prepped and draped in usual sterile fashion: area prepped with isopropyl alcohol. Anesthesia: the lesion was anesthetized in a standard fashion   Anesthetic:  1% lidocaine w/ epinephrine 1-100,000 buffered w/ 8.4% NaHCO3 Instrument used: flexible razor blade   Hemostasis achieved with: aluminum chloride   Outcome: patient tolerated procedure well   Post-procedure details: wound care instructions given   Additional details:  Mupirocin and a bandage applied  Specimen 2 - Surgical pathology Differential Diagnosis: r/o Irritated Acrochordon vs Nevus vs Neurofibroma Check Margins: No Erythematous skin colored papule 0.3cm  Left Medial Thigh D  Epidermal / dermal shaving  Lesion length (cm):  0.3 Lesion width (cm):  0.3 Margin per side (cm):  0.1 Total excision diameter (cm):  0.5 Informed consent: discussed and consent obtained   Timeout: patient name, date of birth, surgical site, and procedure verified   Patient was prepped and draped in usual sterile fashion: area prepped with isopropyl  alcohol. Anesthesia: the lesion was anesthetized in a standard fashion   Anesthetic:  1% lidocaine w/ epinephrine 1-100,000 buffered w/ 8.4% NaHCO3 Instrument used: flexible razor blade   Hemostasis achieved with: aluminum chloride   Outcome: patient tolerated procedure well    Post-procedure details: wound care instructions given   Additional details:  Mupirocin and a bandage applied  Specimen 3 - Surgical pathology Differential Diagnosis: r/o Irritated Acrochordon vs Nevus vs Neurofibroma Check Margins: No Erythematous skin colored papule 0.3cm  Left Medial Thigh C  Epidermal / dermal shaving  Lesion length (cm):  0.2 Lesion width (cm):  0.2 Margin per side (cm):  0.1 Total excision diameter (cm):  0.4 Informed consent: discussed and consent obtained   Timeout: patient name, date of birth, surgical site, and procedure verified   Patient was prepped and draped in usual sterile fashion: area prepped with isopropyl alcohol. Anesthesia: the lesion was anesthetized in a standard fashion   Anesthetic:  1% lidocaine w/ epinephrine 1-100,000 buffered w/ 8.4% NaHCO3 Instrument used: flexible razor blade   Hemostasis achieved with: aluminum chloride   Outcome: patient tolerated procedure well   Post-procedure details: wound care instructions given   Additional details:  Mupirocin and a bandage applied  Specimen 4 - Surgical pathology Differential Diagnosis: r/o Irritated Acrochordon vs Nevus vs Neurofibroma Check Margins: No Erythematous skin colored papule 0.2cm  Right Medial Thigh B  Epidermal / dermal shaving  Lesion length (cm):  0.4 Lesion width (cm):  0.4 Margin per side (cm):  0.1 Total excision diameter (cm):  0.6 Informed consent: discussed and consent obtained   Timeout: patient name, date of birth, surgical site, and procedure verified   Patient was prepped and draped in usual sterile fashion: area prepped with isopropyl alcohol. Anesthesia: the lesion was anesthetized in a standard fashion   Anesthetic:  1% lidocaine w/ epinephrine 1-100,000 buffered w/ 8.4% NaHCO3 Instrument used: flexible razor blade   Hemostasis achieved with: aluminum chloride   Outcome: patient tolerated procedure well   Post-procedure details: wound care  instructions given   Additional details:  Mupirocin and a bandage applied  Specimen 5 - Surgical pathology Differential Diagnosis: r/o Irritated Acrochordon vs Nevus vs Neurofibroma Check Margins: No Erythematous skin colored papule 0.4cm  Right Medial Thigh A  Epidermal / dermal shaving  Lesion length (cm):  0.3 Lesion width (cm):  0.3 Margin per side (cm):  0.1 Total excision diameter (cm):  0.5 Informed consent: discussed and consent obtained   Timeout: patient name, date of birth, surgical site, and procedure verified   Patient was prepped and draped in usual sterile fashion: area prepped with isopropyl alcohol. Anesthesia: the lesion was anesthetized in a standard fashion   Anesthetic:  1% lidocaine w/ epinephrine 1-100,000 buffered w/ 8.4% NaHCO3 Instrument used: flexible razor blade   Hemostasis achieved with: aluminum chloride   Outcome: patient tolerated procedure well   Post-procedure details: wound care instructions given   Additional details:  Mupirocin and a bandage applied  Specimen 6 - Surgical pathology Differential Diagnosis: r/o Irritated Acrochordon vs Nevus vs Neurofibroma Check Margins: No Erythematous skin colored papule 0.3cm  Start mupirocin daily with bandage changes.  Return in about 1 year (around 10/16/2020) for TBSE.   Graciella Belton, RMA, am acting as scribe for Forest Gleason, MD .   Documentation: I have reviewed the above documentation for accuracy and completeness, and I agree with the above.  Forest Gleason, MD

## 2019-10-18 NOTE — Progress Notes (Signed)
1. Skin , (F) left medial thigh [place] 2. Skin , (E) left medial thigh [place] 3. Skin , (D) left medial thigh [place] 4. Skin , (C) left medial thigh [place] 5. Skin , (B) right medial thigh [place] 6. Skin , (A) right medial thigh [place]  Skin tags, benign, no additional treatment needed.

## 2019-10-23 ENCOUNTER — Telehealth: Payer: Self-pay

## 2019-10-23 NOTE — Telephone Encounter (Signed)
Patient returned Tammy's phone call. Patient advised of BX results. Patient to keep follow up appointment.

## 2019-10-24 ENCOUNTER — Telehealth: Payer: Self-pay

## 2019-10-24 NOTE — Telephone Encounter (Signed)
Patient was called and informed that her bx results were all benign skin tags and no further treatment is needed.

## 2019-10-31 ENCOUNTER — Telehealth: Payer: Self-pay

## 2019-10-31 ENCOUNTER — Ambulatory Visit: Payer: Managed Care, Other (non HMO) | Admitting: Dermatology

## 2019-10-31 NOTE — Telephone Encounter (Signed)
If she is concerned for infection, we really should see her in the office (can add her on today) as we may need to do a bacterial culture.  However, I would also confirm that she has not been using neosporin to the area as this frequently causes redness and problems with biopsy sites.

## 2019-10-31 NOTE — Telephone Encounter (Signed)
Patient called regarding skin tags that were cut off several weeks ago that she now thinks are infected on one side of the thigh. She has used all of the Mupirocin Ointment and unable to keep band aids on due to the location and a lot of walking during the day at her job. She described the areas as red, drainage and an odor.

## 2019-10-31 NOTE — Telephone Encounter (Signed)
Patient advised of information per Dr. Laurence Ferrari. She is coming in at 4:30 today.

## 2019-11-15 ENCOUNTER — Encounter: Payer: Self-pay | Admitting: Dermatology

## 2020-01-25 ENCOUNTER — Emergency Department: Payer: Managed Care, Other (non HMO)

## 2020-01-25 ENCOUNTER — Emergency Department
Admission: EM | Admit: 2020-01-25 | Discharge: 2020-01-25 | Disposition: A | Discharge status: Home / Self Care | Payer: Managed Care, Other (non HMO) | Attending: Emergency Medicine | Admitting: Emergency Medicine

## 2020-01-25 ENCOUNTER — Other Ambulatory Visit: Payer: Self-pay

## 2020-01-25 DIAGNOSIS — R0789 Other chest pain: Secondary | ICD-10-CM | POA: Diagnosis not present

## 2020-01-25 DIAGNOSIS — J40 Bronchitis, not specified as acute or chronic: Secondary | ICD-10-CM | POA: Diagnosis not present

## 2020-01-25 LAB — BASIC METABOLIC PANEL
Anion gap: 7 (ref 5–15)
BUN: 16 mg/dL (ref 6–20)
CO2: 26 mmol/L (ref 22–32)
Calcium: 9.3 mg/dL (ref 8.9–10.3)
Chloride: 105 mmol/L (ref 98–111)
Creatinine, Ser: 0.8 mg/dL (ref 0.44–1.00)
GFR calc Af Amer: >60 mL/min (ref >60)
GFR calc non Af Amer: >60 mL/min (ref >60)
Glucose, Bld: 82 mg/dL (ref 70–99)
Potassium: 4.1 mmol/L (ref 3.5–5.1)
Sodium: 138 mmol/L (ref 135–145)

## 2020-01-25 LAB — CBC
HCT: 39.4 % (ref 36.0–46.0)
Hemoglobin: 14 g/dL (ref 12.0–15.0)
MCH: 31.3 pg (ref 26.0–34.0)
MCHC: 35.5 g/dL (ref 30.0–36.0)
MCV: 87.9 fL (ref 80.0–100.0)
Platelets: 263 10*3/uL (ref 150–400)
RBC: 4.48 MIL/uL (ref 3.87–5.11)
RDW: 12.2 % (ref 11.5–15.5)
WBC: 6.3 10*3/uL (ref 4.0–10.5)
nRBC: 0 % (ref 0.0–0.2)

## 2020-01-25 LAB — TROPONIN I (HIGH SENSITIVITY)
Troponin I (High Sensitivity): <2 ng/L (ref <18)
Troponin I (High Sensitivity): <2 ng/L (ref <18)

## 2020-01-25 MED ORDER — PREDNISONE 20 MG PO TABS
40.0000 mg | ORAL_TABLET | Freq: Every day | ORAL | 0 refills | Status: AC
Start: 2020-01-25 — End: 2020-01-30

## 2020-01-25 MED ORDER — PREDNISONE 20 MG PO TABS
40.0000 mg | ORAL_TABLET | Freq: Once | ORAL | Status: AC
  Administered 2020-01-25: 40 mg via ORAL
  Filled 2020-01-25: qty 2

## 2020-01-25 MED ORDER — AZITHROMYCIN 250 MG PO TABS: ORAL_TABLET | ORAL | 0 refills | Status: AC

## 2020-01-25 MED ORDER — IPRATROPIUM-ALBUTEROL 0.5-2.5 (3) MG/3ML IN SOLN
3.0000 mL | Freq: Once | RESPIRATORY_TRACT | Status: AC
  Administered 2020-01-25: 3 mL via RESPIRATORY_TRACT
  Filled 2020-01-25: qty 3

## 2020-01-25 MED ORDER — SODIUM CHLORIDE 0.9% FLUSH
3.0000 mL | Freq: Once | INTRAVENOUS | Status: AC
  Administered 2020-01-25: 10:00:00 3 mL via INTRAVENOUS

## 2020-01-25 MED ORDER — ALBUTEROL SULFATE HFA 108 (90 BASE) MCG/ACT IN AERS
2.0000 | INHALATION_SPRAY | Freq: Four times a day (QID) | RESPIRATORY_TRACT | 0 refills | Status: DC | PRN
Start: 2020-01-25 — End: 2022-12-10

## 2020-01-25 NOTE — ED Triage Notes (Signed)
Pt comes via POV from home with c/o CP and SOB that started yesterday. Pt states it has since gotten worse.  Pt states central chest pain and some pain in her back.

## 2020-01-25 NOTE — ED Notes (Signed)
Pt verbalized understanding of discharge instructions. NAD at this time. 

## 2020-01-25 NOTE — ED Provider Notes (Signed)
[facility] Emergency Department Provider Note  ____________________________________________   First MD Initiated Contact with Patient 01/25/20 0915     (approximate)  I have reviewed the triage vital signs and the nursing notes.   HISTORY  Chief Complaint Chest Pain and Shortness of Breath    HPI Yolanda Guerrero is a 59 y.o. female  Here with chest pain, mild cough, hoarseness. Pt reports that over the past 3-4 days, she has had fairly constant aching, substernal like chest pressure along with a slight increased cough. She has also noticed more cough x last 24 hours with some hoarseness. No fever, chills. She works around "a lot" of people. Her CP is mild now but intermittent, not necessarily associated with inspiration or movement. No specific alleviating factors. Denies cardiac history. Denies leg swelling or recent immobilization. No h/o PE.        Past Medical History:  Diagnosis Date  . Arthritis   . IBS (irritable bowel syndrome)   . Thyroid disease     Patient Active Problem List   Diagnosis Date Noted  . Gastrointestinal bleeding 08/10/2016  . Acute hemorrhagic colitis 08/10/2016  . Influenza A 08/10/2016  . Hypokalemia 08/10/2016    Past Surgical History:  Procedure Laterality Date  . ABDOMINAL HYSTERECTOMY     WITH PARTIAL VAGINACTOMY  . CHOLECYSTECTOMY    . COLONOSCOPY WITH PROPOFOL N/A 01/21/2017   Procedure: COLONOSCOPY WITH PROPOFOL;  Surgeon: Lollie Sails, MD;  Location: [facility] ENDOSCOPY;  Service: Endoscopy;  Laterality: N/A;  . ESOPHAGOGASTRODUODENOSCOPY (EGD) WITH PROPOFOL N/A 01/21/2017   Procedure: ESOPHAGOGASTRODUODENOSCOPY (EGD) WITH PROPOFOL;  Surgeon: Lollie Sails, MD;  Location: [facility] ENDOSCOPY;  Service: Endoscopy;  Laterality: N/A;    Prior to Admission medications   Medication Sig Start Date End Date Taking? Authorizing Provider  cholecalciferol (VITAMIN D3) 25 MCG (1000 UNIT) tablet Take 1,000 Units by  mouth daily.   Yes [provider]  meloxicam (MOBIC) 15 MG tablet Take 7.5 mg by mouth daily as needed for pain.   Yes [provider]  vitamin C (ASCORBIC ACID) 250 MG tablet Take 250 mg by mouth daily.   Yes [provider]  albuterol (VENTOLIN HFA) 108 (90 Base) MCG/ACT inhaler Inhale 2 puffs into the lungs every 6 (six) hours as needed for wheezing or shortness of breath. 01/25/20   Duffy Bruce, MD  azithromycin (ZITHROMAX Z-PAK) 250 MG tablet Take 2 tablets (500 mg) on  Day 1,  followed by 1 tablet (250 mg) once daily on Days 2 through 5. 01/25/20 01/30/20  Duffy Bruce, MD  predniSONE (DELTASONE) 20 MG tablet Take 2 tablets (40 mg total) by mouth daily for 5 days. 01/25/20 01/30/20  Duffy Bruce, MD    Allergies Levaquin [levofloxacin in d5w], Levofloxacin, Penicillins, Codeine, Fexofenadine, Fexofenadine hcl, Oxycodone-acetaminophen, and Tramadol  No family history on file.  Social History Social History   Tobacco Use  . Smoking status: Former Research scientist (life sciences)  . Smokeless tobacco: Never Used  Substance Use Topics  . Alcohol use: No  . Drug use: No    Review of Systems  Review of Systems  Constitutional: Positive for fatigue. Negative for fever.  HENT: Negative for congestion and sore throat.   Eyes: Negative for visual disturbance.  Respiratory: Positive for chest tightness and shortness of breath. Negative for cough.   Cardiovascular: Positive for chest pain.  Gastrointestinal: Negative for abdominal pain, diarrhea, nausea and vomiting.  Genitourinary: Negative for flank pain.  Musculoskeletal: Negative for  back pain and neck pain.  Skin: Negative for rash and wound.  Neurological: Negative for weakness.  All other systems reviewed and are negative.    ____________________________________________  PHYSICAL EXAM:      VITAL SIGNS: ED Triage Vitals  Enc Vitals Group     BP 01/25/20 0908 124/77     Pulse Rate 01/25/20 0908 80     Resp  01/25/20 0908 18     Temp 01/25/20 0908 98.2 F (36.8 C)     Temp src --      SpO2 01/25/20 0908 98 %     Weight 01/25/20 0909 163 lb (73.9 kg)     Height 01/25/20 0909 5\' 3"  (1.6 m)     Head Circumference --      Peak Flow --      Pain Score 01/25/20 0909 3     Pain Loc --      Pain Edu? --      Excl. in [place]? --      Physical Exam Vitals and nursing note reviewed.  Constitutional:      General: She is not in acute distress.    Appearance: She is well-developed.  HENT:     Head: Normocephalic and atraumatic.  Eyes:     Conjunctiva/sclera: Conjunctivae normal.  Cardiovascular:     Rate and Rhythm: Normal rate and regular rhythm.     Heart sounds: Normal heart sounds.  Pulmonary:     Effort: Pulmonary effort is normal. No respiratory distress.     Breath sounds: Wheezing (faint, expiratory) present.  Abdominal:     General: There is no distension.  Musculoskeletal:     Cervical back: Neck supple.  Skin:    General: Skin is warm.     Capillary Refill: Capillary refill takes less than 2 seconds.     Findings: No rash.  Neurological:     Mental Status: She is alert and oriented to person, place, and time.     Motor: No abnormal muscle tone.       ____________________________________________   LABS (all labs ordered are listed, but only abnormal results are displayed)  Labs Reviewed  BASIC METABOLIC PANEL  CBC  TROPONIN I (HIGH SENSITIVITY)  TROPONIN I (HIGH SENSITIVITY)    ____________________________________________  EKG: Normal sinus rhythm, VR 79. PR 156, QRS 84, QTc 428. Noa cute ST elevation or depressions. No ischemia or infarct. ________________________________________  RADIOLOGY All imaging, including plain films, CT scans, and ultrasounds, independently reviewed by me, and interpretations confirmed via formal radiology reads.  ED MD interpretation:   CXR: Clear  Official radiology report(s): DG Chest 2 View  Result Date: 01/25/2020 CLINICAL  DATA:  Chest pain EXAM: CHEST - 2 VIEW COMPARISON:  11/08/2013 chest radiograph. FINDINGS: The heart size and mediastinal contours are within normal limits. Both lungs are clear. No pneumothorax or pleural effusion. No acute osseous abnormality. Multilevel spondylosis. Right upper quadrant surgical clips. IMPRESSION: No active cardiopulmonary disease. Electronically Signed   By: Primitivo Gauze M.D.   On: 01/25/2020 09:45    ____________________________________________  PROCEDURES   Procedure(s) performed (including Critical Care):  Procedures  ____________________________________________  INITIAL IMPRESSION / MDM / [place] / ED COURSE  As part of my medical decision making, I reviewed the following data within the [place] notes reviewed and incorporated, Old chart reviewed, Notes from prior ED visits, and [place] Controlled Substance [place] was evaluated  in Emergency Department on 01/25/2020 for the symptoms described in the history of present illness. She was evaluated in the context of the global COVID-19 pandemic, which necessitated consideration that the patient might be at risk for infection with the SARS-CoV-2 virus that causes COVID-19. Institutional protocols and algorithms that pertain to the evaluation of patients at risk for COVID-19 are in a state of rapid change based on information released by regulatory bodies including the CDC and federal and state organizations. These policies and algorithms were followed during the patient's care in the ED.  Some ED evaluations and interventions may be delayed as a result of limited staffing during the pandemic.*     Medical Decision Making:  59 yo F here with atypical chest pain, mild cough. EKG nonischemic, trop neg x 2 with low-risk HEART score - doubt ACS. She has no leg swelling, tachycardia, tachypnea, or high risk features for DVT or PE. Pain is not c/w dissection. Pt  does endorse increased stress which could be contributing, as well as high exposure to other people @ work which raises question of viral/infectious process. Given her wheezing and h/o requiring albuterol in the past, trial given here w/ improvement. Will tx for possible bronchitis with underlying RAD, d/c with good return precautions.  ____________________________________________  FINAL CLINICAL IMPRESSION(S) / ED DIAGNOSES  Final diagnoses:  Atypical chest pain  Bronchitis     MEDICATIONS GIVEN DURING THIS VISIT:  Medications  sodium chloride flush (NS) 0.9 % injection 3 mL (3 mLs Intravenous Given 01/25/20 0935)  ipratropium-albuterol (DUONEB) 0.5-2.5 (3) MG/3ML nebulizer solution 3 mL (3 mLs Nebulization Given 01/25/20 1032)  predniSONE (DELTASONE) tablet 40 mg (40 mg Oral Given 01/25/20 1031)     ED Discharge Orders         Ordered    azithromycin (ZITHROMAX Z-PAK) 250 MG tablet     Discontinue  Reprint     01/25/20 1305    predniSONE (DELTASONE) 20 MG tablet  Daily     Discontinue  Reprint     01/25/20 1305    albuterol (VENTOLIN HFA) 108 (90 Base) MCG/ACT inhaler  Every 6 hours PRN     Discontinue  Reprint     01/25/20 1305           Note:  This document was prepared using Dragon voice recognition software and may include unintentional dictation errors.   Duffy Bruce, MD 01/25/20 2040

## 2020-01-25 NOTE — ED Notes (Signed)
Kim RN informed of pt in room

## 2020-10-16 ENCOUNTER — Ambulatory Visit: Payer: Managed Care, Other (non HMO) | Admitting: Dermatology

## 2020-10-29 ENCOUNTER — Ambulatory Visit: Payer: Managed Care, Other (non HMO) | Admitting: Dermatology

## 2020-10-29 ENCOUNTER — Other Ambulatory Visit: Payer: Self-pay

## 2020-10-29 DIAGNOSIS — Z1283 Encounter for screening for malignant neoplasm of skin: Secondary | ICD-10-CM | POA: Diagnosis not present

## 2020-10-29 DIAGNOSIS — L821 Other seborrheic keratosis: Secondary | ICD-10-CM

## 2020-10-29 DIAGNOSIS — I8310 Varicose veins of unspecified lower extremity with inflammation: Secondary | ICD-10-CM

## 2020-10-29 DIAGNOSIS — D1800 Hemangioma unspecified site: Secondary | ICD-10-CM

## 2020-10-29 DIAGNOSIS — L814 Other melanin hyperpigmentation: Secondary | ICD-10-CM

## 2020-10-29 DIAGNOSIS — L578 Other skin changes due to chronic exposure to nonionizing radiation: Secondary | ICD-10-CM

## 2020-10-29 DIAGNOSIS — D229 Melanocytic nevi, unspecified: Secondary | ICD-10-CM

## 2020-10-29 DIAGNOSIS — L719 Rosacea, unspecified: Secondary | ICD-10-CM | POA: Diagnosis not present

## 2020-10-29 MED ORDER — METRONIDAZOLE 0.75 % EX CREA: TOPICAL_CREAM | Freq: Two times a day (BID) | CUTANEOUS | 2 refills | Status: AC

## 2020-10-29 NOTE — Patient Instructions (Addendum)
Melanoma ABCDEs  Melanoma is the most dangerous type of skin cancer, and is the leading cause of death from skin disease.  You are more likely to develop melanoma if you:  Have light-colored skin, light-colored eyes, or red or blond hair  Spend a lot of time in the sun  Tan regularly, either outdoors or in a tanning bed  Have had blistering sunburns, especially during childhood  Have a close family member who has had a melanoma  Have atypical moles or large birthmarks  Early detection of melanoma is key since treatment is typically straightforward and cure rates are extremely high if we catch it early.   The first sign of melanoma is often a change in a mole or a new dark spot.  The ABCDE system is a way of remembering the signs of melanoma.  A for asymmetry:  The two halves do not match. B for border:  The edges of the growth are irregular. C for color:  A mixture of colors are present instead of an even brown color. D for diameter:  Melanomas are usually (but not always) greater than 60mm - the size of a pencil eraser. E for evolution:  The spot keeps changing in size, shape, and color.  Please check your skin once per month between visits. You can use a small mirror in front and a large mirror behind you to keep an eye on the back side or your body.   If you see any new or changing lesions before your next follow-up, please call to schedule a visit.  Please continue daily skin protection including broad spectrum sunscreen SPF 30+ to sun-exposed areas, reapplying every 2 hours as needed when you're outdoors.   If you have any questions or concerns for your doctor, please call our main line at [phone] and press option 4 to reach your doctor's medical assistant. If no one answers, please leave a voicemail as directed and we will return your call as soon as possible. Messages left after 4 pm will be answered the following business day.   You may also send [place] a message via  [place]. We typically respond to MyChart messages within 1-2 business days.  For prescription refills, please ask your pharmacy to contact our office. Our fax number is [phone].  If you have an urgent issue when the clinic is closed that cannot wait until the next business day, you can page your doctor at the number below.    Please note that while we do our best to be available for urgent issues outside of office hours, we are not available 24/7.   If you have an urgent issue and are unable to reach [place], you may choose to seek medical care at your doctor's office, retail clinic, urgent care center, or emergency room.  If you have a medical emergency, please immediately call 911 or go to the emergency department.  Pager Numbers  - Dr. Nehemiah Massed: [phone]  - Dr. Laurence Ferrari: [phone]  - Dr. Nicole Kindred: [phone]  In the event of inclement weather, please call our main line at [phone] for an update on the status of any delays or closures.  Dermatology Medication Tips: Please keep the boxes that topical medications come in in order to help keep track of the instructions about where and how to use these. Pharmacies typically print the medication instructions only on the boxes and not directly on the medication tubes.   If your medication is too expensive, please contact our office at  [phone] option 4 or send [place] a message through [place].   We are unable to tell what your co-pay for medications will be in advance as this is different depending on your insurance coverage. However, we may be able to find a substitute medication at lower cost or fill out paperwork to get insurance to cover a needed medication.   If a prior authorization is required to get your medication covered by your insurance company, please allow [place] 1-2 business days to complete this process.  Drug prices often vary depending on where the prescription is filled and some pharmacies may offer cheaper  prices.  The website www.goodrx.com contains coupons for medications through different pharmacies. The prices here do not account for what the cost may be with help from insurance (it may be cheaper with your insurance), but the website can give you the price if you did not use any insurance.  - You can print the associated coupon and take it with your prescription to the pharmacy.  - You may also stop by our office during regular business hours and pick up a GoodRx coupon card.  - If you need your prescription sent electronically to a different pharmacy, notify our office through [facility] or by phone at [phone] option 4.  Recommend daily broad spectrum sunscreen SPF 30+ to sun-exposed areas, reapply every 2 hours as needed. Call for new or changing lesions.  Staying in the shade or wearing long sleeves, sun glasses (UVA+UVB protection) and wide brim hats (4-inch brim around the entire circumference of the hat) are also recommended for sun protection.   Recommend taking Heliocare sun protection supplement daily in sunny weather for additional sun protection. For maximum protection on the sunniest days, you can take up to 2 capsules of regular Heliocare OR take 1 capsule of Heliocare Ultra. For prolonged exposure (such as a full day in the sun), you can repeat your dose of the supplement 4 hours after your first dose. Heliocare can be purchased at [facility] or at [email or web address].   For temporary redness reduction Mix 1 bottle of Afrin original in 1 bottle of Cerave PM, shake well, and apply 1 hour before you want redness to be improved in the morning   Rosacea  What is rosacea? Rosacea (say: ro-zay-sha) is a common skin disease that usually begins as a trend of flushing or blushing easily.  As rosacea progresses, a persistent redness in the center of the face will develop and may gradually spread beyond the nose and cheeks to the forehead and chin.  In some cases, the  ears, chest, and back could be affected.  Rosacea may appear as tiny blood vessels or small red bumps that occur in crops.  Frequently they can contain pus, and are called "pustules".  If the bumps do not contain pus, they are referred to as "papules".  Rarely, in prolonged, untreated cases of rosacea, the oil glands of the nose and cheeks may become permanently enlarged.  This is called rhinophyma, and is seen more frequently in men.  Signs and Risks In its beginning stages, rosacea tends to come and go, which makes it difficult to recognize.  It can start as intermittent flushing of the face.  Eventually, blood vessels may become permanently visible.  Pustules and papules can appear, but can be mistaken for adult acne.  People of all races, ages, genders and ethnic groups are at risk of developing rosacea.  However, it is more common in women (  especially around menopause) and adults with fair skin between the ages of 66 and 92.  Treatment Dermatologists typically recommend a combination of treatments to effectively manage rosacea.  Treatment can improve symptoms and may stop the progression of the rosacea.  Treatment may involve both topical and oral medications.  The tetracycline antibiotics are often used for their anti-inflammatory effect; however, because of the possibility of developing antibiotic resistance, they should not be used long term at full dose.  For dilated blood vessels the options include electrodessication (uses electric current through a small needle), laser treatment, and cosmetics to hide the redness.   With all forms of treatment, improvement is a slow process, and patients may not see any results for the first 3-4 weeks.  It is very important to avoid the sun and other triggers.  Patients must wear sunscreen daily.  Skin Care Instructions: 1. Cleanse the skin with a mild soap such as CeraVe cleanser, Cetaphil cleanser, or Dove soap once or twice daily as needed. 2. Moisturize  with Eucerin Redness Relief Daily Perfecting Lotion (has a subtle green tint), CeraVe Moisturizing Cream, or Oil of Olay Daily Moisturizer with sunscreen every morning and/or night as recommended. 3. Makeup should be "non-comedogenic" (won't clog pores) and be labeled "for sensitive skin". Good choices for cosmetics are: Neutrogena, Almay, and Physician's Formula.  Any product with a green tint tends to offset a red complexion. 4. If your eyes are dry and irritated, use artificial tears 2-3 times per day and cleanse the eyelids daily with baby shampoo.  Have your eyes examined at least every 2 years.  Be sure to tell your eye doctor that you have rosacea. 5. Alcoholic beverages tend to cause flushing of the skin, and may make rosacea worse. 6. Always wear sunscreen, protect your skin from extreme hot and cold temperatures, and avoid spicy foods, hot drinks, and mechanical irritation such as rubbing, scrubbing, or massaging the face.  Avoid harsh skin cleansers, cleansing masks, astringents, and exfoliation. If a particular product burns or makes your face feel tight, then it is likely to flare your rosacea. 7. If you are having difficulty finding a sunscreen that you can tolerate, you may try switching to a chemical-free sunscreen.  These are ones whose active ingredient is zinc oxide or titanium dioxide only.  They should also be fragrance free, non-comedogenic, and labeled for sensitive skin. 8. Rosacea triggers may vary from person to person.  There are a variety of foods that have been reported to trigger rosacea.  Some patients find that keeping a diary of what they were doing when they flared helps them avoid triggers.

## 2020-10-29 NOTE — Progress Notes (Signed)
Follow-Up Visit   Subjective  Yolanda Guerrero is a 60 y.o. female who presents for the following: FBSE (Patient here for full body skin exam and skin cancer screening. ).    She has redness and bumps at the face which are bothersome.  No hx of skin cancer.   The following portions of the chart were reviewed this encounter and updated as appropriate:   Tobacco  Allergies  Meds  Problems  Med Hx  Surg Hx  Fam Hx      Review of Systems:  No other skin or systemic complaints except as noted in HPI or Assessment and Plan.  Objective  Well appearing patient in no apparent distress; mood and affect are within normal limits.  A full examination was performed including scalp, head, eyes, ears, nose, lips, neck, chest, axillae, abdomen, back, buttocks, bilateral upper extremities, bilateral lower extremities, hands, feet, fingers, toes, fingernails, and toenails. All findings within normal limits unless otherwise noted below.  Objective  Face: Mid face erythema with few inflammatory papules   Assessment & Plan  Rosacea Face  Chronic condition with duration or expected duration over one year. Condition is bothersome to patient. Currently flared.  No grittiness or irritation of the eyes.  Start metronidazole 0.75% cream BID   Discussed BBL to treat redness   Rosacea is a chronic progressive skin condition usually affecting the face of adults, causing redness and/or acne bumps. It is treatable but not curable. It sometimes affects the eyes (ocular rosacea) as well. It may respond to topical and/or systemic medication and can flare with stress, sun exposure, alcohol, exercise and some foods.  Daily application of broad spectrum spf 30+ sunscreen to face is recommended to reduce flares.   Ordered Medications: metroNIDAZOLE (METROCREAM) 0.75 % cream   Lentigines - Scattered tan macules - Due to sun exposure - Benign-appering, observe - Recommend daily broad spectrum  sunscreen SPF 30+ to sun-exposed areas, reapply every 2 hours as needed. - Call for any changes  Seborrheic Keratoses - Stuck-on, waxy, tan-brown papules and/or plaques  - Benign-appearing - Discussed benign etiology and prognosis. - Observe - Call for any changes  Melanocytic Nevi - Tan-brown and/or pink-flesh-colored symmetric macules and papules - Benign appearing on exam today - Observation - Call clinic for new or changing moles - Recommend daily use of broad spectrum spf 30+ sunscreen to sun-exposed areas.   Hemangiomas - Red papules - Discussed benign nature - Observe - Call for any changes  Actinic Damage - Chronic condition, secondary to cumulative UV/sun exposure - diffuse scaly erythematous macules with underlying dyspigmentation - Recommend daily broad spectrum sunscreen SPF 30+ to sun-exposed areas, reapply every 2 hours as needed.  - Staying in the shade or wearing long sleeves, sun glasses (UVA+UVB protection) and wide brim hats (4-inch brim around the entire circumference of the hat) are also recommended for sun protection.  - Call for new or changing lesions.  Skin cancer screening performed today.  Varicose Veins - Dilated blue, purple or red veins at the lower extremities - Reassured - These can be treated by sclerotherapy (a procedure to inject a medicine into the veins to make them disappear) if desired, but the treatment is not covered by insurance   Return in about 3 months (around 01/28/2021) for Rosacea.  Graciella Belton, RMA, am acting as scribe for Forest Gleason, MD .  Documentation: I have reviewed the above documentation for accuracy and completeness, and I agree with the above.  Forest Gleason, MD

## 2020-11-03 ENCOUNTER — Encounter: Payer: Self-pay | Admitting: Dermatology

## 2021-03-11 IMAGING — CR DG CHEST 2V
1 series · 2 of 2 positions shown · non-contrast
Comparison: 11/08/2013 chest radiograph.

CLINICAL DATA: Chest pain

EXAM:
CHEST - 2 VIEW

[Series 1: dg chest 2 view · 0.14mm/px · 2 of 2 slices shown]
[im 1/2]
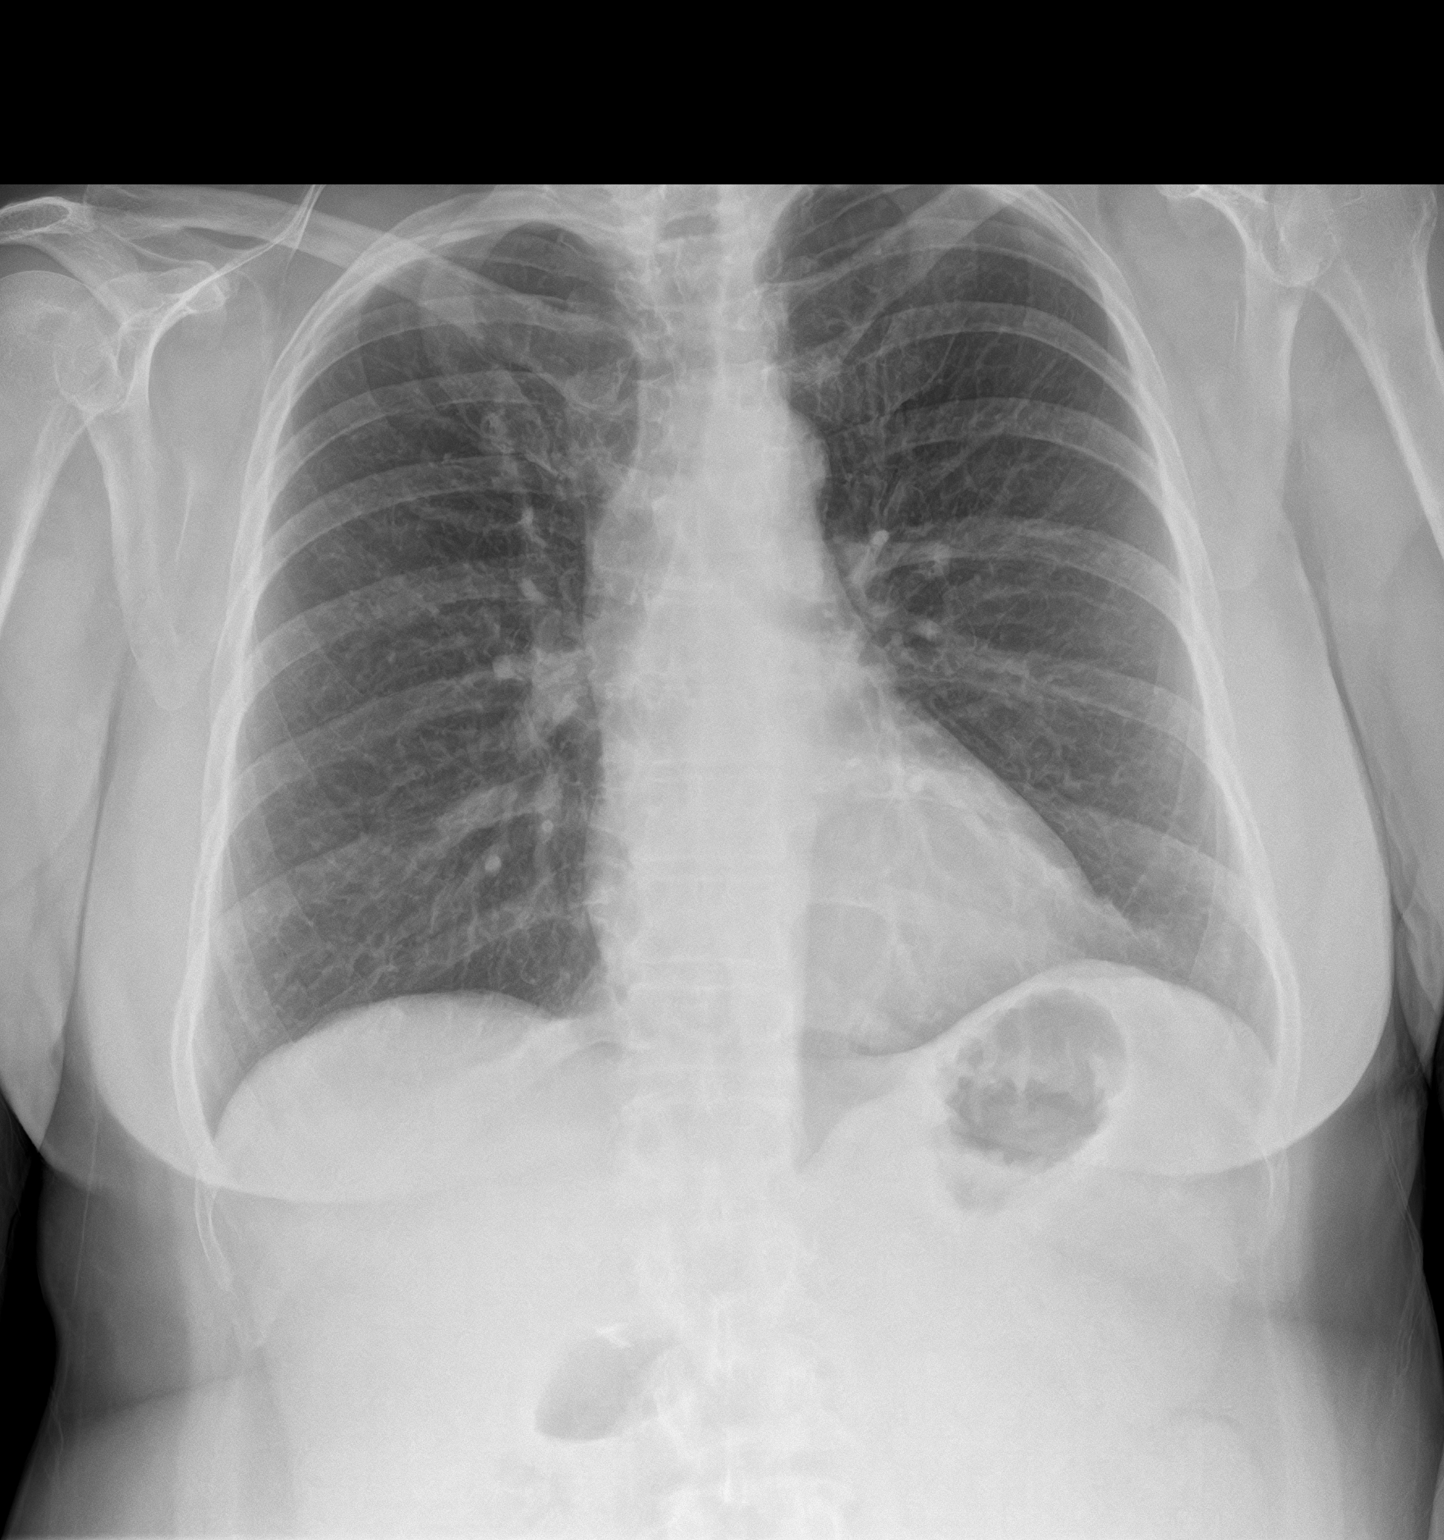
[im 2/2]
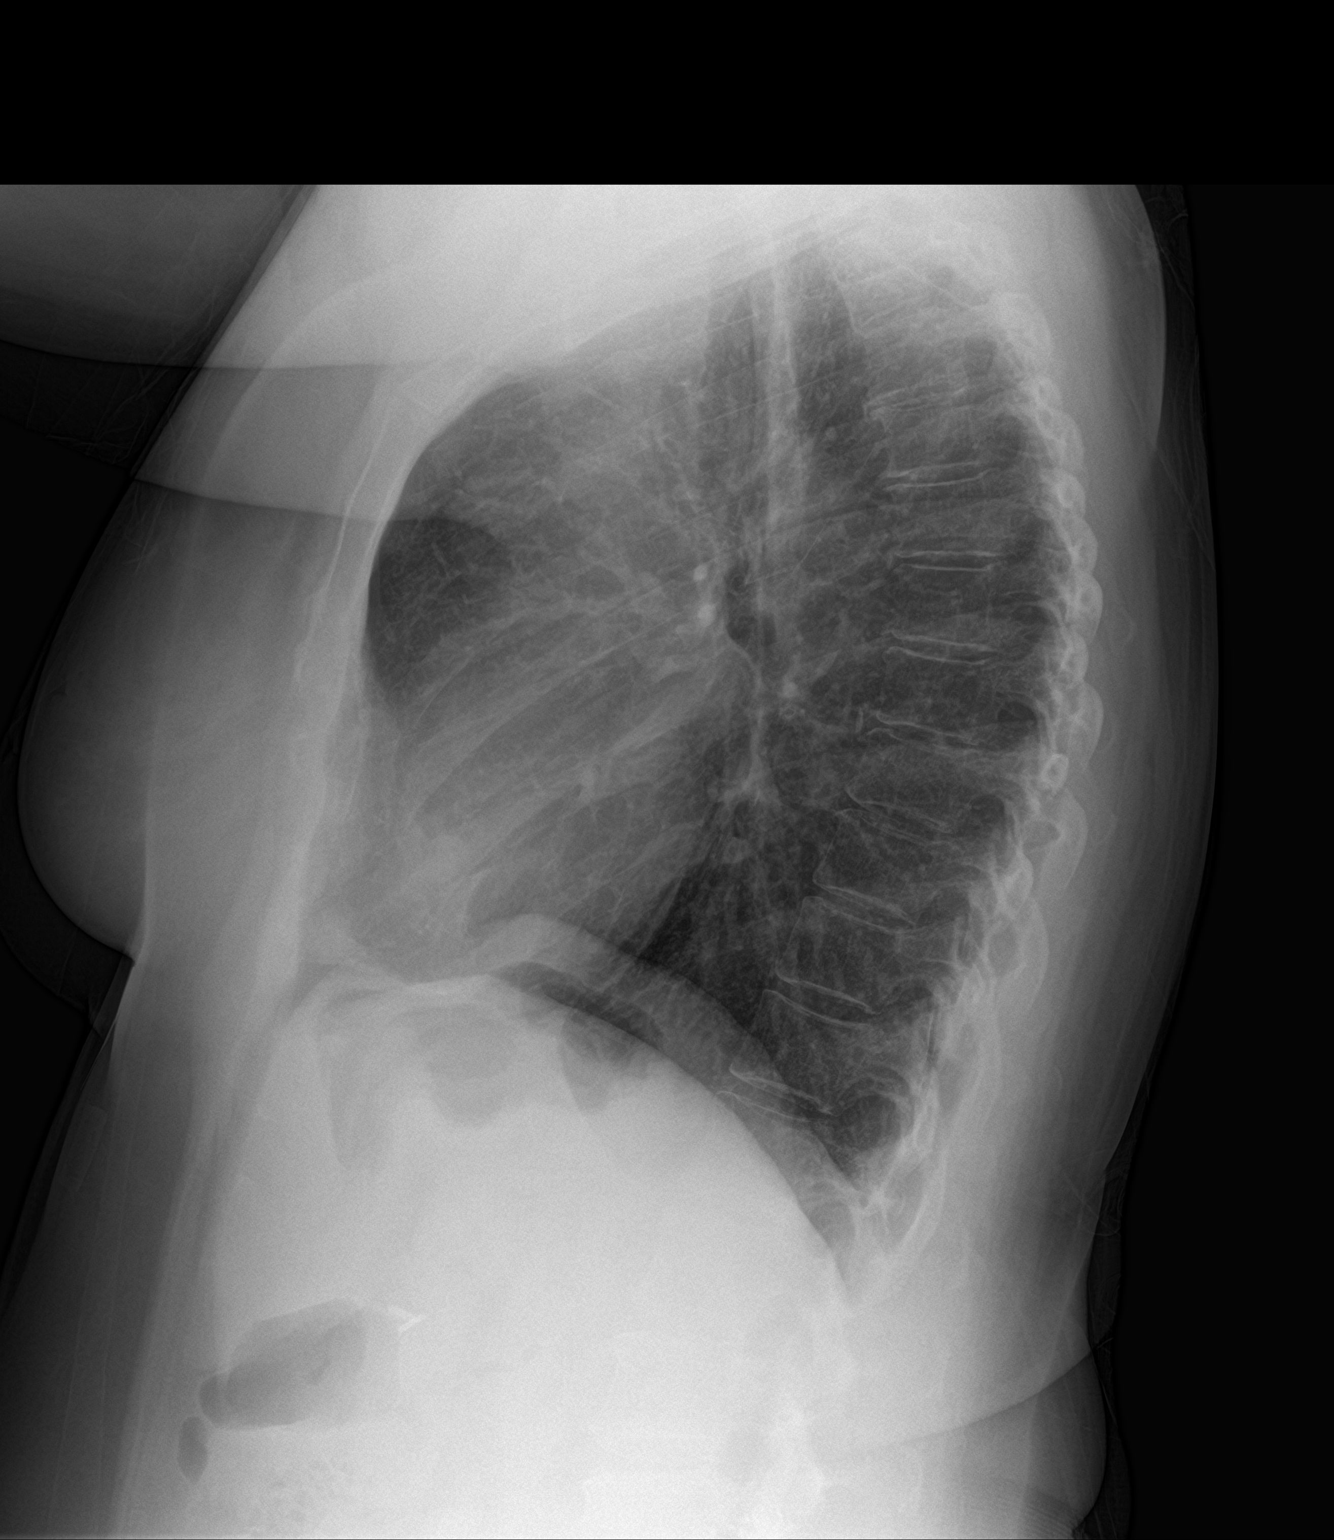

[2 of 2 positions shown; findings below may reference images not displayed]

FINDINGS: The heart size and mediastinal contours are within normal limits.
Both lungs are clear. No pneumothorax or pleural effusion. No acute
osseous abnormality. Multilevel spondylosis. Right upper quadrant
surgical clips.
IMPRESSION: No active cardiopulmonary disease.

## 2021-08-12 ENCOUNTER — Other Ambulatory Visit
Admission: RE | Admit: 2021-08-12 | Discharge: 2021-08-12 | Disposition: A | Discharge status: Home / Self Care | Payer: Managed Care, Other (non HMO) | Source: Ambulatory Visit | Attending: Internal Medicine | Admitting: Internal Medicine

## 2021-08-12 DIAGNOSIS — R197 Diarrhea, unspecified: Secondary | ICD-10-CM | POA: Insufficient documentation

## 2021-08-12 DIAGNOSIS — R1013 Epigastric pain: Secondary | ICD-10-CM | POA: Diagnosis present

## 2021-08-12 DIAGNOSIS — R112 Nausea with vomiting, unspecified: Secondary | ICD-10-CM | POA: Insufficient documentation

## 2021-08-12 DIAGNOSIS — Z209 Contact with and (suspected) exposure to unspecified communicable disease: Secondary | ICD-10-CM | POA: Diagnosis not present

## 2021-08-12 LAB — COMPREHENSIVE METABOLIC PANEL
ALT: 20 U/L (ref 0–44)
AST: 28 U/L (ref 15–41)
Albumin: 3.6 g/dL (ref 3.5–5.0)
Alkaline Phosphatase: 77 U/L (ref 38–126)
Anion gap: 6 (ref 5–15)
BUN: 21 mg/dL (ref 8–23)
CO2: 28 mmol/L (ref 22–32)
Calcium: 8.8 mg/dL — ABNORMAL LOW (ref 8.9–10.3)
Chloride: 104 mmol/L (ref 98–111)
Creatinine, Ser: 0.71 mg/dL (ref 0.44–1.00)
GFR, Estimated: >60 mL/min (ref >60)
Glucose, Bld: 85 mg/dL (ref 70–99)
Potassium: 3.9 mmol/L (ref 3.5–5.1)
Sodium: 138 mmol/L (ref 135–145)
Total Bilirubin: 0.6 mg/dL (ref 0.3–1.2)
Total Protein: 6.9 g/dL (ref 6.5–8.1)

## 2021-10-29 ENCOUNTER — Ambulatory Visit: Payer: Managed Care, Other (non HMO) | Admitting: Dermatology

## 2021-12-24 ENCOUNTER — Ambulatory Visit: Payer: Managed Care, Other (non HMO) | Admitting: Dermatology

## 2022-01-14 ENCOUNTER — Ambulatory Visit: Payer: Managed Care, Other (non HMO) | Admitting: Dermatology

## 2022-08-05 ENCOUNTER — Telehealth: Payer: Self-pay

## 2022-08-05 NOTE — Telephone Encounter (Signed)
Gastroenterology Pre-Procedure Review  Request Date: Office Visit Scheduled 08/11/22 Requesting Physician: Dr. Vicente Males  PATIENT REVIEW QUESTIONS: The patient responded to the following health history questions as indicated:    1. Are you having any GI issues? yes (bloating after meals, frequent bowel movements with loose stools, hemorrhoids) 2. Do you have a personal history of Polyps? no last colonoscopy performed by Dr. Gustavo Lah 01/21/17 No Polyps.  Grade I Hemorrhoids noted. 3. Do you have a family history of Colon Cancer or Polyps? no 4. Diabetes Mellitus? no 5. Joint replacements in the past 12 months?no 6. Major health problems in the past 3 months?no 7. Any artificial heart valves, MVP, or defibrillator?no    MEDICATIONS & ALLERGIES:    Patient reports the following regarding taking any anticoagulation/antiplatelet therapy:   Plavix, Coumadin, Eliquis, Xarelto, Lovenox, Pradaxa, Brilinta, or Effient? no Aspirin? no  Patient confirms/reports the following medications:  Current Outpatient Medications  Medication Sig Dispense Refill   albuterol (VENTOLIN HFA) 108 (90 Base) MCG/ACT inhaler Inhale 2 puffs into the lungs every 6 (six) hours as needed for wheezing or shortness of breath. 6.7 g 0   cholecalciferol (VITAMIN D3) 25 MCG (1000 UNIT) tablet Take 1,000 Units by mouth daily.     meloxicam (MOBIC) 15 MG tablet Take 7.5 mg by mouth daily as needed for pain.     vitamin C (ASCORBIC ACID) 250 MG tablet Take 250 mg by mouth daily.     No current facility-administered medications for this visit.    Patient confirms/reports the following allergies:  Allergies  Allergen Reactions   Levaquin [Levofloxacin In D5w] Other (See Comments)    Heart Flutter   Levofloxacin Other (See Comments)    Other reaction(s): Other (See Comments) SOB, weak, chest tightness, shaky SOB, weak, chest tightness, shaky SOB, weak, chest tightness, shaky    Penicillins Hives    Has patient had a PCN  reaction causing immediate rash, facial/tongue/throat swelling, SOB or lightheadedness with hypotension: No Has patient had a PCN reaction causing severe rash involving mucus membranes or skin necrosis: No Has patient had a PCN reaction that required hospitalization: No Has patient had a PCN reaction occurring within the last 10 years: Yes If all of the above answers are "NO", then may proceed with Cephalosporin use. Has patient had a PCN reaction causing immediate rash, facial/tongue/throat swelling, SOB or lightheadedness with hypotension: No Has patient had a PCN reaction causing severe rash involving mucus membranes or skin necrosis: No Has patient had a PCN reaction that required hospitalization: No Has patient had a PCN reaction occurring within the last 10 years: Yes If all of the above answers are "NO", then may proceed with Cephalosporin use.    Codeine Nausea And Vomiting and Nausea Only   Fexofenadine Nausea Only   Fexofenadine Hcl Nausea Only   Oxycodone-Acetaminophen Nausea And Vomiting    Other reaction(s): Vomiting   Tramadol Nausea And Vomiting and Nausea Only    No orders of the defined types were placed in this encounter.   AUTHORIZATION INFORMATION Primary Insurance: 1D#: Group #:  Secondary Insurance: 1D#: Group #:  SCHEDULE INFORMATION: Date: Office Visit Scheduled 08/11/22 Time: Location: [facility]

## 2022-08-10 ENCOUNTER — Encounter: Payer: Self-pay | Admitting: Gastroenterology

## 2022-08-10 ENCOUNTER — Other Ambulatory Visit: Payer: Self-pay

## 2022-08-10 ENCOUNTER — Ambulatory Visit: Payer: BC Managed Care – PPO | Admitting: Gastroenterology

## 2022-08-10 VITALS — BP 120/67 | HR 83 | Temp 98.3°F | Ht 63.0 in | Wt 175.0 lb

## 2022-08-10 DIAGNOSIS — R1013 Epigastric pain: Secondary | ICD-10-CM

## 2022-08-10 DIAGNOSIS — R197 Diarrhea, unspecified: Secondary | ICD-10-CM | POA: Diagnosis not present

## 2022-08-10 NOTE — Addendum Note (Signed)
Addended by: Wayna Chalet on: 08/10/2022 02:33 PM   Modules accepted: Orders

## 2022-08-10 NOTE — Patient Instructions (Signed)
Gastroparesis  Gastroparesis is a condition in which food takes longer than normal to empty from the stomach. This condition is also known as delayed gastric emptying. It is usually a long-term (chronic) condition. There is no cure, but there are treatments and things that you can do at home to help relieve symptoms. Treating the underlying condition that causes gastroparesis can also help relieve symptoms. What are the causes? In many cases, the cause of this condition is not known. Possible causes include: A hormone (endocrine) disorder, such as hypothyroidism or diabetes. A nervous system disease, such as Parkinson's disease or multiple sclerosis. Cancer, infection, or surgery that affects the stomach or vagus nerve. The vagus nerve runs from your chest, through your neck, and to the lower part of your brain. A connective tissue disorder, such as scleroderma. Certain medicines. What increases the risk? You are more likely to develop this condition if: You have certain disorders or diseases. These may include: An endocrine disorder. An eating disorder. Amyloidosis. Scleroderma. Parkinson's disease. Multiple sclerosis. Cancer or infection of the stomach or the vagus nerve. You have had surgery on your stomach or vagus nerve. You take certain medicines. You are female. What are the signs or symptoms? Symptoms of this condition include: Feeling full after eating very little or a loss of appetite. Nausea, vomiting, or heartburn. Bloating of your abdomen. Inconsistent blood sugar (glucose) levels on blood tests. Unexplained weight loss. Acid from the stomach coming up into the esophagus (gastroesophageal reflux). Sudden tightening (spasm) of the stomach, which can be painful. Symptoms may come and go. Some people may not notice any symptoms. How is this diagnosed? This condition is diagnosed with tests, such as: Tests that check how long it takes food to move through the stomach and  intestines. These tests include: Upper gastrointestinal (GI) series. For this test, you drink a liquid that shows up well on X-rays, and then X-rays are taken of your intestines. Gastric emptying scintigraphy. For this test, you eat food that contains a small amount of radioactive material, and then scans are taken. Wireless capsule GI monitoring system. For this test, you swallow a pill (capsule) that records information about how foods and fluid move through your stomach. Gastric manometry. For this test, a tube is passed down your throat and into your stomach to measure electrical and muscular activity. Endoscopy. For this test, a long, thin tube with a camera and light on the end is passed down your throat and into your stomach to check for problems in your stomach lining. Ultrasound. This test uses sound waves to create images of the inside of your body. This can help rule out gallbladder disease or pancreatitis as a cause of your symptoms. How is this treated? There is no cure for this condition, but treatment and home care may relieve symptoms. Treatment may include: Treating the underlying cause. Managing your symptoms by making changes to your diet and exercise habits. Taking medicines to control nausea and vomiting and to stimulate stomach muscles. Getting food through a feeding tube in the hospital. This may be done in severe cases. Having surgery to insert a device called a gastric electrical stimulator into your body. This device helps improve stomach emptying and control nausea and vomiting. Follow these instructions at home: Take over-the-counter and prescription medicines only as told by your health care provider. Follow instructions from your health care provider about eating or drinking restrictions. Your health care provider may recommend that you: Eat smaller meals more often. Eat   low-fat foods. Eat low-fiber forms of high-fiber foods. For example, eat cooked vegetables instead  of raw vegetables. Have only liquid foods instead of solid foods. Liquid foods are easier to digest. Drink enough fluid to keep your urine pale yellow. Exercise as often as told by your health care provider. Keep all follow-up visits. This is important. Contact a health care provider if you: Notice that your symptoms do not improve with treatment. Have new symptoms. Get help right away if you: Have severe pain in your abdomen that does not improve with treatment. Have nausea that is severe or does not go away. Vomit every time you drink fluids. Summary Gastroparesis is a long-term (chronic) condition in which food takes longer than normal to empty from the stomach. Symptoms include nausea, vomiting, heartburn, bloating of your abdomen, and loss of appetite. Eating smaller portions, low-fat foods, and low-fiber forms of high-fiber foods may help you manage your symptoms. Get help right away if you have severe pain in your abdomen. This information is not intended to replace advice given to you by your health care provider. Make sure you discuss any questions you have with your health care provider. Document Revised: 11/05/2019 Document Reviewed: 11/05/2019 Elsevier Patient Education  2023 Elsevier Inc.  

## 2022-08-10 NOTE — Progress Notes (Signed)
Yolanda Bellows MD, MRCP(U.K) [address]  [place]  [place], [place] [address]  Main: [phone]  Fax: [phone]   Gastroenterology Consultation  Referring Provider:     Remi Haggard, FNP Primary Care Physician:  Remi Haggard, FNP Primary Gastroenterologist:  Dr. Jonathon Guerrero  Reason for Consultation:    Diarrhea        HPI:   Yolanda Guerrero is a 62 y.o. y/o female referred for consultation & management  by . Remi Haggard, FNP.     Here to see me for diarrhea for his ongoing for over a month.  Watery multiple bowel movements a day denies any gas or bloating denies any use of NSAIDs denies any use of artificial sugars weakness.  No unintentional weight loss.  She did have COVID 2 months back and since then feels like food sits in the stomach.  Longer.  Of time.  Having some heartburn has been given famotidine but not yet started taking it.  Last colonoscopy in 2018 by Dr. Gustavo Lah  was normal.  No recent abdominal imaging.  In 2018 CT abdomen showed diffuse mucosal edema and soft tissue inflammation along the distal transverse colon descending concerning for colitis.  Past Medical History:  Diagnosis Date   Arthritis    IBS (irritable bowel syndrome)    Thyroid disease     Past Surgical History:  Procedure Laterality Date   ABDOMINAL HYSTERECTOMY     WITH PARTIAL VAGINACTOMY   CHOLECYSTECTOMY     COLONOSCOPY WITH PROPOFOL N/A 01/21/2017   Procedure: COLONOSCOPY WITH PROPOFOL;  Surgeon: Lollie Sails, MD;  [facility] ENDOSCOPY;  Service: Endoscopy;  Laterality: N/A;   ESOPHAGOGASTRODUODENOSCOPY (EGD) WITH PROPOFOL N/A 01/21/2017   Procedure: ESOPHAGOGASTRODUODENOSCOPY (EGD) WITH PROPOFOL;  Surgeon: Lollie Sails, MD;  Location: [facility] ENDOSCOPY;  Service: Endoscopy;  Laterality: N/A;    Prior to Admission medications   Medication Sig Start Date End Date Taking? Authorizing Provider  albuterol (VENTOLIN HFA) 108 (90 Base) MCG/ACT inhaler Inhale  2 puffs into the lungs every 6 (six) hours as needed for wheezing or shortness of breath. 01/25/20   Duffy Bruce, MD  aspirin EC 81 MG tablet Take 81 mg by mouth daily.    [provider]  cholecalciferol (VITAMIN D3) 25 MCG (1000 UNIT) tablet Take 1,000 Units by mouth daily.    [provider]  Estradiol (DIVIGEL) 1 MG/GM GEL APPLY 1 GEL PACKET EVERY DAY AS DIRECTED    [provider]  famotidine (PEPCID) 40 MG tablet Take 40 mg by mouth daily. 07/24/22   [provider]  hydrOXYzine (VISTARIL) 25 MG capsule Take 25 mg by mouth 2 (two) times daily. 08/03/22   [provider]  meloxicam (MOBIC) 15 MG tablet Take 7.5 mg by mouth daily as needed for pain.    [provider]  mupirocin ointment (BACTROBAN) 2 % Apply 1 Application topically 2 (two) times daily.    [provider]  nitrofurantoin (MACRODANTIN) 100 MG capsule Take 100 mg by mouth.    [provider]  promethazine-dextromethorphan (PROMETHAZINE-DM) 6.25-15 MG/5ML syrup     [provider]  valACYclovir (VALTREX) 500 MG tablet Take 1,000 mg by mouth 3 (three) times daily. 02/24/22   [provider]  vitamin C (ASCORBIC ACID) 250 MG tablet Take 250 mg by mouth daily.    [provider]    No family history on file.   Social History   Tobacco Use  Smoking status: Former   Smokeless tobacco: Never  Substance Use Topics   Alcohol use: No   Drug use: No    Allergies as of 08/10/2022 - Review Complete 08/10/2022  Allergen Reaction Noted   Levaquin [levofloxacin in d5w] Other (See Comments) 01/20/2017   Levofloxacin Other (See Comments) 05/14/2016   Penicillins Hives 09/18/2013   Codeine Nausea And Vomiting and Nausea Only 09/18/2013   Fexofenadine Nausea Only 09/18/2013   Fexofenadine hcl Nausea Only 01/20/2017   Oxycodone-acetaminophen Nausea And Vomiting 11/07/2015   Tramadol Nausea And Vomiting and Nausea Only 09/23/2015     Review of Systems:    All systems reviewed and negative except where noted in HPI.   Physical Exam:  BP 120/67   Pulse 83   Temp 98.3 F (36.8 C) (Oral)   Ht '5\' 3"'$  (1.6 m)   Wt 175 lb (79.4 kg)   BMI 31.00 kg/m  No LMP recorded. Patient has had a hysterectomy. Psych:  Alert and cooperative. Normal mood and affect. General:   Alert,  Well-developed, well-nourished, pleasant and cooperative in NAD Head:  Normocephalic and atraumatic. Eyes:  Sclera clear, no icterus.   Conjunctiva pink. Abdomen:  Normal bowel sounds.  No bruits.  Soft, non-tender and non-distended without masses, hepatosplenomegaly or hernias noted.  No guarding or rebound tenderness.    Neurologic:  Alert and oriented x3;  grossly normal neurologically. Psych:  Alert and cooperative. Normal mood and affect.  Imaging Studies: No results found.  Assessment and Plan:   [name] is a 62 y.o. y/o female has been referred for diarrhea of 1 month duration.  Colonoscopy in 2018 was normal.  Also has some features suggestive of gastroparesis possibly postinfectious after an episode of COVID  Plan 1.  Stool studies to rule out infection 2.  Trial of gastroparesis diet usually postinfectious gastroparesis resolves in a few months 3.  Start taking famotidine which she has already been prescribed for reflux 4.  At next visit if no better may need EGD and colonoscopy  Follow up in 8 to 12 weeks  Dr Yolanda Bellows MD,MRCP(U.K)

## 2022-08-16 LAB — GI PROFILE, STOOL, PCR

## 2022-08-16 LAB — C DIFFICILE, CYTOTOXIN B

## 2022-08-16 LAB — CALPROTECTIN, FECAL: Calprotectin, Fecal: <5 ug/g (ref 0–120)

## 2022-08-16 LAB — C DIFFICILE TOXINS A+B W/RFLX: C difficile Toxins A+B, EIA: NEGATIVE

## 2022-10-04 DIAGNOSIS — M542 Cervicalgia: Secondary | ICD-10-CM | POA: Insufficient documentation

## 2022-11-08 ENCOUNTER — Ambulatory Visit: Payer: BC Managed Care – PPO | Admitting: Gastroenterology

## 2022-12-10 ENCOUNTER — Ambulatory Visit: Payer: BC Managed Care – PPO | Admitting: Physician Assistant

## 2022-12-10 ENCOUNTER — Encounter: Payer: Self-pay | Admitting: Physician Assistant

## 2022-12-10 ENCOUNTER — Other Ambulatory Visit: Payer: Self-pay

## 2022-12-10 VITALS — BP 133/82 | HR 77 | Temp 98.0°F | Ht 63.0 in | Wt 174.6 lb

## 2022-12-10 DIAGNOSIS — K580 Irritable bowel syndrome with diarrhea: Secondary | ICD-10-CM | POA: Diagnosis not present

## 2022-12-10 DIAGNOSIS — R1013 Epigastric pain: Secondary | ICD-10-CM | POA: Diagnosis not present

## 2022-12-10 DIAGNOSIS — K625 Hemorrhage of anus and rectum: Secondary | ICD-10-CM | POA: Diagnosis not present

## 2022-12-10 MED ORDER — PEG 3350-KCL-NA BICARB-NACL 420 G PO SOLR: 4000.0000 mL | Freq: Once | ORAL | 0 refills | Status: AC

## 2022-12-10 NOTE — Progress Notes (Signed)
Yolanda Amy, PA-C [address]  [address]  [place], [place] [address]  Main: [phone]  Fax: [phone]   Primary Care Physician: Armando Gang, FNP  Primary Gastroenterologist:  Dr. Wyline Mood / Yolanda Amy, PA-C    HPI:  Yolanda Guerrero is a 62 y.o. 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  She saw Dr. Tobi Bastos 07/2022.  Was given famotidine for reflux, gastroparesis diet, and stool studies to rule out infection.  GI pathogen panel and C. difficile stool test were all negative for infections.  Current Symptoms: For the past 5 weeks patient has had increasing upper abdominal pain.  She has burning in the epigastrium.  Has intermittent cramping in the RUQ and LUQ.  She feels like food gets stuck in the epigastrium.  She has nausea but no vomiting.  She denies chest pain or heartburn.  Diarrhea resolved after she saw Dr. Tobi Bastos in January.  Patient states she has irritable bowel syndrome and has had intermittent episodes of diarrhea which comes and goes for many years.  She reports increased fatigue in the past month.  She is very reluctant to take any medication.  She denies NSAID use.  She did not start taking famotidine that was recommended.  She has had some episodes of bright red rectal bleeding with blood on the tissue after she has an episode of diarrhea.  Of note, her mother died from liver disease in the past year at age 41.  No family history of colon cancer or IBD.  EGD 01/2017 by Dr. Marva Panda showed mild gastritis, otherwise normal.  Biopsies NEGATIVE for celiac, H. pylori, and Barrett's.  Colonoscopy in 01/2017 by Dr. Marva Panda showed nonbleeding small internal hemorrhoids.  Otherwise was normal.  No polyps.  Biopsies negative for microscopic colitis.  2018 CT abdomen showed diffuse mucosal edema and soft tissue inflammation along the distal transverse colon descending concerning for colitis.    Current  Outpatient Medications  Medication Sig Dispense Refill   aspirin EC 81 MG tablet Take 81 mg by mouth daily.     famotidine (PEPCID) 40 MG tablet Take 40 mg by mouth daily.     valACYclovir (VALTREX) 500 MG tablet Take 1,000 mg by mouth 3 (three) times daily.     polyethylene glycol-electrolytes (NULYTELY) 420 g solution Take 4,000 mLs by mouth once for 1 dose. Use as directed for your colonoscopy preparation 4000 mL 0   No current facility-administered medications for this visit.    Allergies as of 12/10/2022 - Review Complete 12/10/2022  Allergen Reaction Noted   Levaquin [levofloxacin in d5w] Other (See Comments) 01/20/2017   Levofloxacin Other (See Comments) 05/14/2016   Penicillins Hives 09/18/2013   Codeine Nausea And Vomiting and Nausea Only 09/18/2013   Fexofenadine Nausea Only 09/18/2013   Fexofenadine hcl Nausea Only 01/20/2017   Oxycodone-acetaminophen Nausea And Vomiting 11/07/2015   Tramadol Nausea And Vomiting and Nausea Only 09/23/2015    Past Medical History:  Diagnosis Date   Arthritis    IBS (irritable bowel syndrome)    Thyroid disease     Past Surgical History:  Procedure Laterality Date   ABDOMINAL HYSTERECTOMY     WITH PARTIAL VAGINACTOMY   CHOLECYSTECTOMY     COLONOSCOPY WITH PROPOFOL N/A 01/21/2017   Procedure: COLONOSCOPY WITH PROPOFOL;  Surgeon: Christena Deem, MD;  Location: [facility] ENDOSCOPY;  Service: Endoscopy;  Laterality: N/A;   ESOPHAGOGASTRODUODENOSCOPY (EGD) WITH PROPOFOL N/A 01/21/2017  Procedure: ESOPHAGOGASTRODUODENOSCOPY (EGD) WITH PROPOFOL;  Surgeon: Christena Deem, MD;  Location: [facility] ENDOSCOPY;  Service: Endoscopy;  Laterality: N/A;    Review of Systems:    All systems reviewed and negative except where noted in HPI.   Physical Examination:   BP 133/82   Pulse 77   Temp 98 F (36.7 C)   Ht 5\' 3"  (1.6 m)   Wt 174 lb 9.6 oz (79.2 kg)   BMI 30.93 kg/m   General: Well-nourished, well-developed in no acute distress.  Eyes:  No icterus. Conjunctivae pink. Mouth: Oropharyngeal mucosa moist and pink , no lesions erythema or exudate. Lungs: Clear to auscultation bilaterally. Non-labored. Heart: Regular rate and rhythm, no murmurs rubs or gallops.  Abdomen: Bowel sounds are normal; Abdomen is Soft; No hepatosplenomegaly, masses or hernias;  Moderate Epigastric Abdominal Tenderness; rest of abdomen is not tender.  No guarding or rebound tenderness. Extremities: No lower extremity edema. No clubbing or deformities. Neuro: Alert and oriented x 3.  Grossly intact. Skin: Warm and dry, no jaundice.   Psych: Alert and cooperative, normal mood and affect.   Imaging Studies: No results found.  Assessment and Plan:   Yolanda Guerrero is a 62 y.o. y/o female epigastric pain, rectal bleeding, and irritable bowel syndrome with diarrhea.  I am suspicious for gastritis, internal hemorrhoid bleeding, and irritable bowel syndrome.  Diarrhea has currently improved.  I am ordering lab work and updated EGD and colonoscopy for further evaluation.  Epigastric Pain  Labs:  CBC, CMP, Lipase   Start famotidine 20 mg twice daily.  Scheduling EGD I discussed risks of EGD with patient to include risk of bleeding, perforation, and risk of sedation.   Patient expressed understanding and agrees to proceed with EGD.   Rectal bleeding; history of Internal Hemorrhoids  Scheduling Colonoscopy I discussed risks of colonoscopy with patient to include risk of bleeding, colon perforation, and risk of sedation.   Patient expressed understanding and agrees to proceed with colonoscopy.   Irritable bowel syndrome, diarrhea predominant  Discuss further treatment at next f/u OV.  She may benefit from low FODMAP diet.  She is very reluctant to take any medication.   Yolanda Amy, PA-C  Follow up in 4 weeks after EGD/colonoscopy with TG

## 2022-12-11 LAB — CBC WITH DIFFERENTIAL
Basophils Absolute: 0.1 10*3/uL (ref 0.0–0.2)
Basos: 1 %
EOS (ABSOLUTE): 0.2 10*3/uL (ref 0.0–0.4)
Eos: 4 %
Hematocrit: 40.3 % (ref 34.0–46.6)
Hemoglobin: 13.6 g/dL (ref 11.1–15.9)
Immature Grans (Abs): 0 10*3/uL (ref 0.0–0.1)
Immature Granulocytes: 0 %
Lymphocytes Absolute: 2.5 10*3/uL (ref 0.7–3.1)
Lymphs: 41 %
MCH: 30.4 pg (ref 26.6–33.0)
MCHC: 33.7 g/dL (ref 31.5–35.7)
MCV: 90 fL (ref 79–97)
Monocytes Absolute: 0.3 10*3/uL (ref 0.1–0.9)
Monocytes: 5 %
Neutrophils Absolute: 3 10*3/uL (ref 1.4–7.0)
Neutrophils: 49 %
RBC: 4.48 x10E6/uL (ref 3.77–5.28)
RDW: 12.2 % (ref 11.7–15.4)
WBC: 6 10*3/uL (ref 3.4–10.8)

## 2022-12-11 LAB — COMPREHENSIVE METABOLIC PANEL
ALT: 13 IU/L (ref 0–32)
AST: 20 IU/L (ref 0–40)
Albumin/Globulin Ratio: 1.6 (ref 1.2–2.2)
Albumin: 4.1 g/dL (ref 3.9–4.9)
Alkaline Phosphatase: 116 IU/L (ref 44–121)
BUN/Creatinine Ratio: 21 (ref 12–28)
BUN: 14 mg/dL (ref 8–27)
Bilirubin Total: 0.4 mg/dL (ref 0.0–1.2)
CO2: 21 mmol/L (ref 20–29)
Calcium: 9.4 mg/dL (ref 8.7–10.3)
Chloride: 104 mmol/L (ref 96–106)
Creatinine, Ser: 0.67 mg/dL (ref 0.57–1.00)
Globulin, Total: 2.5 g/dL (ref 1.5–4.5)
Glucose: 82 mg/dL (ref 70–99)
Potassium: 4.2 mmol/L (ref 3.5–5.2)
Sodium: 141 mmol/L (ref 134–144)
Total Protein: 6.6 g/dL (ref 6.0–8.5)
eGFR: 99 mL/min/{1.73_m2} (ref >59)

## 2022-12-11 LAB — LIPASE: Lipase: 116 U/L — ABNORMAL HIGH (ref 14–72)

## 2022-12-13 ENCOUNTER — Telehealth: Payer: Self-pay

## 2022-12-13 DIAGNOSIS — R748 Abnormal levels of other serum enzymes: Secondary | ICD-10-CM

## 2022-12-13 NOTE — Progress Notes (Signed)
Notify patient CBC and CMP labs are normal.  White count, hemoglobin, kidney and liver test are normal.  Lipase pancreas test is mildly elevated. **I recommend avoid all alcohol.  Please schedule abdominal CT with and without contrast to further evaluate pancreas.  Diagnosis elevated lipase and epigastric pain.

## 2022-12-13 NOTE — Telephone Encounter (Signed)
Patient notified.   Notify patient CBC and CMP labs are normal.  White count, hemoglobin, kidney and liver test are normal.  Lipase pancreas test is mildly elevated. **I recommend avoid all alcohol.  Please schedule abdominal CT with and without contrast to further evaluate pancreas.  Diagnosis elevated lipase and epigastric pain.    CT abdomen /pelvis with  contrast scheduled Outpatient Imaging  [address] [place]   June 10,  @ 1:45 arrival. No restrictions.

## 2022-12-14 ENCOUNTER — Telehealth: Payer: Self-pay

## 2022-12-14 NOTE — Telephone Encounter (Signed)
Called and did PA over the Phone and they pended the request. They said the provider needed to call [phone] option 1,1, 2 . They have also pended the facility so that is under the review also. Pend case number is [id number]  fax number [phone] attention nurse reviewer full name, dob , id number. CT scan is schedule for 12/20/2022. Had to change facility to [facility] because Outpatient imaging was out of network for patient.   Fax clinicals to [phone] and got confirmation fax went through

## 2022-12-15 NOTE — Telephone Encounter (Signed)
Patient verbalized understanding she is okay with going to [facility]

## 2022-12-16 ENCOUNTER — Telehealth: Payer: Self-pay | Admitting: Physician Assistant

## 2022-12-16 NOTE — Telephone Encounter (Signed)
PT left vm return call that she received today not sure what it was in ref to leave a message she will call back

## 2022-12-16 NOTE — Telephone Encounter (Signed)
Called the Doctor line to let them know that patient would go to [facility]. She states that they will cover a CT abdominal and Pelvis with Contrast and she has to go to [facility] on [address] [place], [place] [address] Phone number is [phone]. Called to find out where I needed to fax the order to.   Order number is [id number]  Good from 12/14/2022 to 01/12/2023  Called [facility] at [phone] and the receptionist states we had to talk to the receptionist and she said she did not have the fax number and she would have to transfer me to the referral specialist. I asked if I could talk to them now and if she was not available I could hold. She states that she is not in the same office as her and she would have to transfer the call. Left a message for Amanada and informed her of the issue and said I needed a call back ASAP gave her my direct number.   Called central scheduling and canceled the ct scan at out patient imaging for 12/20/2022

## 2022-12-16 NOTE — Telephone Encounter (Signed)
Called [facility] again and she said that Chipper Oman is the only referral coordinator I could talk to and she said she tried to call her and she did not answer she said to give her another hour and to call back

## 2022-12-16 NOTE — Telephone Encounter (Signed)
Called and talk to a receptionist and she said she said I would need to talk to Marchelle Folks also and she is the only one that could help. Informed her that I called at 9 and left her a message and have not got a call back and I needed to wait on her to call me back. She then Transfer me to the department where she works in and it was a recording and said I had a call and and they would call me when my time was ready.  Called back [facility] and talk to victoria told her everything I have told every other receptionist and she said I am so sorry and gave me they fax number to there office which is [phone]. She said it still has to go through the referral coordinator and then the scheduler. Informed her I appreciate her help because she the only one that has tried to help me but could I speak to a Print production planner. She said yes. I talk to Okey Regal she said to send the information to her email which her email was [email or web address] and also fax the information. She said to put my phone number so she could call me to let me know when the patient is schedule. She states she will get it taken care of. Faxed referral and received confirmation and also emailed information to [employer]

## 2022-12-17 ENCOUNTER — Telehealth: Payer: Self-pay

## 2022-12-17 NOTE — Telephone Encounter (Signed)
PT  return phone call wanting to get lab results and pt would like to know if her CT scan has been scheduled?

## 2022-12-20 ENCOUNTER — Ambulatory Visit: Admission: RE | Admit: 2022-12-20 | Payer: BC Managed Care – PPO | Source: Ambulatory Visit

## 2022-12-20 ENCOUNTER — Telehealth: Payer: Self-pay

## 2022-12-20 NOTE — Telephone Encounter (Signed)
CT schedulers needed a signed order and they faxed sent me a blank order form. Faxed order to them. Patient is schedule for 12/21/2022 at 12/21/2022 at 1:00 at [facility]

## 2022-12-20 NOTE — Telephone Encounter (Signed)
Per pt would like to know lab results and pt would like to know if her CT was scheduled?

## 2022-12-20 NOTE — Telephone Encounter (Signed)
Informed patient and patient verbalized understanding  

## 2022-12-20 NOTE — Telephone Encounter (Signed)
[facility] and they do not have the patient in the chart they said I would have to talk to [facility] and informed her I have done this multiply times and no one has called me back. Called and left a message for call back with Marchelle Folks and told her to call me back ASAP. Called [facility] back and asked to talk to the office manager carol. They said she is not in the office and I could not leave a message but they could leave her a message for me. Informed them they do not take care of there patients when they need something as soon as possible. Called the insurance company and got the facility change to:   [facility]  Address: [address] [place], [place] [address] Hours:  Open ? Closes 8?PM Phone: [phone] Suggest an edit Called and got fax number which is [phone]. They said they will call patient today and scheduled patient for tomorrow or Wednesday. Called and informed patient and patient appreciated all our help getting her scheduled. She states she will be expecting there call. Gave the address and phone number of the office

## 2022-12-22 ENCOUNTER — Telehealth: Payer: Self-pay

## 2022-12-22 ENCOUNTER — Other Ambulatory Visit: Payer: Self-pay

## 2022-12-22 DIAGNOSIS — K838 Other specified diseases of biliary tract: Secondary | ICD-10-CM

## 2022-12-22 NOTE — Telephone Encounter (Signed)
Order the Imaging for the MRI MRCP. Called and carlon and did PA over the phone and they did approved the scan. Auth number is [id number] good from 12/22/2022 to 01/20/2023 . Called [facility] at [phone]. Called and got patient schedule for MRI for 01/04/2023 arrive at 11:00 nothing to eat or drink 6 hours before. Faxed order to [phone].  Called patient and left a message for call back

## 2022-12-22 NOTE — Telephone Encounter (Signed)
Patient return call and verbalized understanding of results and time of MRI and date. She states she will let us know if she has sever upper abdominal pain

## 2022-12-22 NOTE — Progress Notes (Signed)
Call and notify patient bile duct is moderately dilated 13 mm.  She has had previous cholecystectomy.  I recommend Schedule abdominal MRI/MRCP (at [facility] in [place]) to rule out bile duct stone.  No other abnormality on CT scan.  Pending MRI/MRCP results, she may need EUS or ERCP procedure.  She should let me know if she starts having severe upper abdominal pain.

## 2022-12-22 NOTE — Telephone Encounter (Signed)
-----   Message from Celso Amy, [place] sent at 12/22/2022 12:22 PM EDT ----- Call and notify patient bile duct is moderately dilated 13 mm.  She has had previous cholecystectomy.  I recommend Schedule abdominal MRI/MRCP (at [facility] in [place]) to rule out bile duct stone.  No other abnormality on CT scan.  Pending MRI/MRCP results, she may need EUS or ERCP procedure.  She should let me know if she starts having severe upper abdominal pain.

## 2022-12-23 ENCOUNTER — Other Ambulatory Visit: Payer: Self-pay

## 2022-12-24 ENCOUNTER — Other Ambulatory Visit: Payer: Self-pay

## 2022-12-24 ENCOUNTER — Telehealth: Payer: Self-pay

## 2022-12-24 NOTE — Telephone Encounter (Signed)
Pt returned call and appointment information was provided to the patient per message.

## 2022-12-24 NOTE — Telephone Encounter (Signed)
Spoke with [place] at [facility]- patient is scheduled 6/27/ @ 9:45 am [facility]- Nothing to eat 6 hours prior. Clear liquids and medications allowed prior to exam. Please remove all jewelry prior to exam.   Left message for patient to return call to office to be sure she is aware of this appointment-appointment made by [facility].

## 2022-12-24 NOTE — Telephone Encounter (Signed)
Left patient message to return call to office -   Call and notify patient bile duct is moderately dilated 13 mm.  She has had previous cholecystectomy.  I recommend Schedule abdominal MRI/MRCP (at [facility] in [place]) to rule out bile duct stone.  No other abnormality on CT scan.  Pending MRI/MRCP results, she may need EUS or ERCP procedure.  She should let me know if she starts having severe upper abdominal pain.

## 2023-01-06 ENCOUNTER — Telehealth: Payer: Self-pay

## 2023-01-06 ENCOUNTER — Other Ambulatory Visit: Payer: Self-pay

## 2023-01-06 NOTE — Telephone Encounter (Signed)
Patient verbalized understanding of results  

## 2023-01-06 NOTE — Telephone Encounter (Signed)
-----   Message from Celso Amy, [place] sent at 01/06/2023  3:52 PM EDT ----- Call and notify patient her abdominal MRI/MRCP looks good. No evidence of cancer or masses.  There are a few benign incidental findings which are not worrisome.  Benign liver and kidney cyst.  Pancreatic divisum which is a normal pancreatic duct variant anatomy.  No pancreatic masses or inflammation.  No bile duct stones.  Continue with current plan. Celso Amy, PA-C  ----- Message ----- From: Denman George, CMA Sent: 01/06/2023  11:13 AM EDT To: Celso Amy, PA-C

## 2023-02-09 ENCOUNTER — Encounter
Admission: RE | Disposition: A | Discharge status: Home / Self Care | Payer: Self-pay | Source: Home / Self Care | Attending: Gastroenterology

## 2023-02-09 ENCOUNTER — Other Ambulatory Visit: Payer: Self-pay

## 2023-02-09 ENCOUNTER — Ambulatory Visit: Payer: BC Managed Care – PPO | Admitting: Certified Registered"

## 2023-02-09 ENCOUNTER — Ambulatory Visit
Admission: RE | Admit: 2023-02-09 | Discharge: 2023-02-09 | Disposition: A | Discharge status: Home / Self Care | Payer: BC Managed Care – PPO | Attending: Gastroenterology | Admitting: Gastroenterology

## 2023-02-09 ENCOUNTER — Encounter: Payer: Self-pay | Admitting: Gastroenterology

## 2023-02-09 DIAGNOSIS — Z87891 Personal history of nicotine dependence: Secondary | ICD-10-CM | POA: Diagnosis not present

## 2023-02-09 DIAGNOSIS — K580 Irritable bowel syndrome with diarrhea: Secondary | ICD-10-CM

## 2023-02-09 DIAGNOSIS — K529 Noninfective gastroenteritis and colitis, unspecified: Secondary | ICD-10-CM | POA: Diagnosis present

## 2023-02-09 DIAGNOSIS — K625 Hemorrhage of anus and rectum: Secondary | ICD-10-CM

## 2023-02-09 DIAGNOSIS — K5730 Diverticulosis of large intestine without perforation or abscess without bleeding: Secondary | ICD-10-CM | POA: Insufficient documentation

## 2023-02-09 DIAGNOSIS — R1013 Epigastric pain: Secondary | ICD-10-CM

## 2023-02-09 HISTORY — PX: ESOPHAGOGASTRODUODENOSCOPY (EGD) WITH PROPOFOL: SHX5813

## 2023-02-09 HISTORY — PX: COLONOSCOPY WITH PROPOFOL: SHX5780

## 2023-02-09 HISTORY — PX: BIOPSY: SHX5522

## 2023-02-09 SURGERY — COLONOSCOPY WITH PROPOFOL
Anesthesia: General

## 2023-02-09 MED ORDER — SUCCINYLCHOLINE CHLORIDE 200 MG/10ML IV SOSY
PREFILLED_SYRINGE | INTRAVENOUS | Status: AC
  Filled 2023-02-09: qty 10

## 2023-02-09 MED ORDER — LIDOCAINE HCL (PF) 2 % IJ SOLN
INTRAMUSCULAR | Status: AC
  Filled 2023-02-09: qty 35

## 2023-02-09 MED ORDER — LIDOCAINE HCL URETHRAL/MUCOSAL 2 % EX GEL
CUTANEOUS | Status: AC
  Filled 2023-02-09: qty 10

## 2023-02-09 MED ORDER — PROPOFOL 1000 MG/100ML IV EMUL
INTRAVENOUS | Status: AC
  Filled 2023-02-09: qty 100

## 2023-02-09 MED ORDER — STERILE WATER FOR IRRIGATION IR SOLN
Status: DC | PRN
  Administered 2023-02-09: 50 mL

## 2023-02-09 MED ORDER — PROPOFOL 10 MG/ML IV BOLUS
INTRAVENOUS | Status: AC
  Filled 2023-02-09: qty 40

## 2023-02-09 MED ORDER — SODIUM CHLORIDE 0.9 % IV SOLN: INTRAVENOUS | Status: DC

## 2023-02-09 MED ORDER — PROPOFOL 10 MG/ML IV BOLUS
INTRAVENOUS | Status: DC | PRN
  Administered 2023-02-09: 40 mg via INTRAVENOUS
  Administered 2023-02-09: 50 mg via INTRAVENOUS
  Administered 2023-02-09: 150 ug/kg/min via INTRAVENOUS

## 2023-02-09 MED ORDER — PHENYLEPHRINE 80 MCG/ML (10ML) SYRINGE FOR IV PUSH (FOR BLOOD PRESSURE SUPPORT)
PREFILLED_SYRINGE | INTRAVENOUS | Status: AC
  Filled 2023-02-09: qty 10

## 2023-02-09 MED ORDER — LIDOCAINE HCL (CARDIAC) PF 100 MG/5ML IV SOSY
PREFILLED_SYRINGE | INTRAVENOUS | Status: DC | PRN
  Administered 2023-02-09 (×2): 100 mg via INTRAVENOUS

## 2023-02-09 NOTE — Anesthesia Preprocedure Evaluation (Signed)
Anesthesia Evaluation  Patient identified by MRN, date of birth, ID band Patient awake    Reviewed: Allergy & Precautions, NPO status , Patient's Chart, lab work & pertinent test results  History of Anesthesia Complications (+) PONV and history of anesthetic complications  Airway Mallampati: II       Dental  (+) Dental Advidsory Given, Poor Dentition   Pulmonary neg shortness of breath, neg COPD, neg recent URI, former smoker          Cardiovascular (-) hypertension(-) Past MI and (-) CHF + Valvular Problems/Murmurs (murmur, no tx)      Neuro/Psych neg Seizures    GI/Hepatic Neg liver ROS,neg GERD  ,,  Endo/Other  neg diabetes    Renal/GU negative Renal ROS     Musculoskeletal   Abdominal   Peds  Hematology   Anesthesia Other Findings Past Medical History: No date: Arthritis No date: IBS (irritable bowel syndrome) No date: Thyroid disease   Reproductive/Obstetrics                             Anesthesia Physical Anesthesia Plan  ASA: 2  Anesthesia Plan: General   Post-op Pain Management:    Induction: Intravenous  PONV Risk Score and Plan: 4 or greater and Propofol infusion and TIVA  Airway Management Planned: Nasal Cannula and Natural Airway  Additional Equipment:   Intra-op Plan:   Post-operative Plan:   Informed Consent: I have reviewed the patients History and Physical, chart, labs and discussed the procedure including the risks, benefits and alternatives for the proposed anesthesia with the patient or authorized representative who has indicated his/her understanding and acceptance.       Plan Discussed with:   Anesthesia Plan Comments:         Anesthesia Quick Evaluation

## 2023-02-09 NOTE — Op Note (Signed)
[facility] Gastroenterology Patient Name: Yolanda Guerrero Procedure Date: 02/09/2023 7:32 AM MRN: [id number] Account #: [id number] Date of Birth: [birth year] Admit Type: Outpatient Age: 62 Room: [facility] ENDO ROOM 3 Gender: Female Note Status: Finalized Instrument Name: Prentice Docker [id number] Procedure:             Colonoscopy Indications:           Chronic diarrhea Providers:             Wyline Mood MD, MD Medicines:             Monitored Anesthesia Care Complications:         No immediate complications. Procedure:             Pre-Anesthesia Assessment:                        - Prior to the procedure, a History and Physical was                         performed, and patient medications, allergies and                         sensitivities were reviewed. The patient's tolerance                         of previous anesthesia was reviewed.                        - The risks and benefits of the procedure and the                         sedation options and risks were discussed with the                         patient. All questions were answered and informed                         consent was obtained.                        - ASA Grade Assessment: II - A patient with mild                         systemic disease.                        After obtaining informed consent, the colonoscope was                         passed under direct vision. Throughout the procedure,                         the patient's blood pressure, pulse, and oxygen                         saturations were monitored continuously. The                         Colonoscope was introduced through the anus and  advanced to the the cecum, identified by the                         appendiceal orifice. The colonoscopy was performed                         with ease. The patient tolerated the procedure well.                         The quality of the bowel preparation was good. The                          ileocecal valve, appendiceal orifice, and rectum were                         photographed. Findings:      The perianal and digital rectal examinations were normal.      Multiple medium-mouthed diverticula were found in the sigmoid colon.      Normal mucosa was found in the entire colon. Biopsies were taken with a       cold forceps for histology.      The exam was otherwise without abnormality on direct and retroflexion       views. Impression:            - Diverticulosis in the sigmoid colon.                        - Normal mucosa in the entire examined colon. Biopsied.                        - The examination was otherwise normal on direct and                         retroflexion views. Recommendation:        - Discharge patient to home.                        - Resume previous diet.                        - Continue present medications.                        - Await pathology results.                        - Repeat colonoscopy for surveillance based on                         pathology results.                        - Return to GI office as previously scheduled. Procedure Code(s):     --- Professional ---                        [phone], Colonoscopy, flexible; with biopsy, single or                         multiple Diagnosis Code(s):     --- Professional ---  K52.9, Noninfective gastroenteritis and colitis,                         unspecified                        K57.30, Diverticulosis of large intestine without                         perforation or abscess without bleeding CPT copyright 2022 American Medical Association. All rights reserved. The codes documented in this report are preliminary and upon coder review may  be revised to meet current compliance requirements. Wyline Mood, MD Wyline Mood MD, MD 02/09/2023 8:10:13 AM This report has been signed electronically. Number of Addenda: 0 Note Initiated On: 02/09/2023 7:32 AM Scope  Withdrawal Time: 0 hours 9 minutes 49 seconds  Total Procedure Duration: 0 hours 13 minutes 52 seconds  Estimated Blood Loss:  Estimated blood loss: none.      [facility]

## 2023-02-09 NOTE — Transfer of Care (Signed)
Immediate Anesthesia Transfer of Care Note  Patient: Yolanda Guerrero  Procedure(s) Performed: COLONOSCOPY WITH PROPOFOL ESOPHAGOGASTRODUODENOSCOPY (EGD) WITH PROPOFOL BIOPSY  Patient Location: PACU  Anesthesia Type:General  Level of Consciousness: drowsy  Airway & Oxygen Therapy: Patient Spontanous Breathing  Post-op Assessment: Report given to RN and Post -op Vital signs reviewed and stable  Post vital signs: stable  Last Vitals:  Vitals Value Taken Time  BP 117/72 02/09/23 0809  Temp    Pulse 90 02/09/23 0809  Resp 17 02/09/23 0809  SpO2 95 % 02/09/23 0809  Vitals shown include unfiled device data.  Last Pain:  Vitals:   02/09/23 0704  TempSrc: Temporal  PainSc: 0-No pain         Complications: No notable events documented.

## 2023-02-09 NOTE — H&P (Signed)
Wyline Mood, MD [address], [address], [place], [place], [address] [address], [address], [place], [place], [address] Phone: [phone]  Fax: [phone]  Primary Care Physician:  Armando Gang, FNP   Pre-Procedure History & Physical: HPI:  Yolanda Guerrero is a 62 y.o. female is here for an endoscopy and colonoscopy    Past Medical History:  Diagnosis Date   Arthritis    IBS (irritable bowel syndrome)    Thyroid disease     Past Surgical History:  Procedure Laterality Date   ABDOMINAL HYSTERECTOMY     WITH PARTIAL VAGINACTOMY   CHOLECYSTECTOMY     COLONOSCOPY WITH PROPOFOL N/A 01/21/2017   Procedure: COLONOSCOPY WITH PROPOFOL;  Surgeon: Christena Deem, MD;  Location: [facility] ENDOSCOPY;  Service: Endoscopy;  Laterality: N/A;   ESOPHAGOGASTRODUODENOSCOPY (EGD) WITH PROPOFOL N/A 01/21/2017   Procedure: ESOPHAGOGASTRODUODENOSCOPY (EGD) WITH PROPOFOL;  Surgeon: Christena Deem, MD;  Location: [facility] ENDOSCOPY;  Service: Endoscopy;  Laterality: N/A;    Prior to Admission medications   Medication Sig Start Date End Date Taking? Authorizing Provider  aspirin EC 81 MG tablet Take 81 mg by mouth daily. Patient not taking: Reported on 02/09/2023    [provider]  famotidine (PEPCID) 40 MG tablet Take 40 mg by mouth daily. Patient not taking: Reported on 02/09/2023 07/24/22   [provider]  valACYclovir (VALTREX) 500 MG tablet Take 1,000 mg by mouth 3 (three) times daily. Patient not taking: Reported on 02/09/2023 02/24/22   [provider]    Allergies as of 12/10/2022 - Review Complete 12/10/2022  Allergen Reaction Noted   Levaquin [levofloxacin in d5w] Other (See Comments) 01/20/2017   Levofloxacin Other (See Comments) 05/14/2016   Penicillins Hives 09/18/2013   Codeine Nausea And Vomiting and Nausea Only 09/18/2013   Fexofenadine Nausea Only 09/18/2013   Fexofenadine hcl Nausea Only 01/20/2017   Oxycodone-acetaminophen Nausea And  Vomiting 11/07/2015   Tramadol Nausea And Vomiting and Nausea Only 09/23/2015    History reviewed. No pertinent family history.  Social History   Socioeconomic History   Marital status: Single    Spouse name: Not on file   Number of children: Not on file   Years of education: Not on file   Highest education level: Not on file  Occupational History   Not on file  Tobacco Use   Smoking status: Former    Types: Cigarettes   Smokeless tobacco: Never  Vaping Use   Vaping status: Never Used  Substance and Sexual Activity   Alcohol use: No   Drug use: No   Sexual activity: Not on file  Other Topics Concern   Not on file  Social History Narrative   Not on file   Social Determinants of Health   Financial Resource Strain: Not on file  Food Insecurity: Not on file  Transportation Needs: Not on file  Physical Activity: Not on file  Stress: Not on file  Social Connections: Not on file  Intimate Partner Violence: Not on file    Review of Systems: See HPI, otherwise negative ROS  Physical Exam: BP 125/87   Pulse 95   Temp (!) 97.5 F (36.4 C) (Temporal)   Resp 18   Ht 5\' 3"  (1.6 m)   Wt 76.9 kg   SpO2 99%   BMI 30.04 kg/m  General:   Alert,  pleasant and cooperative in NAD Head:  Normocephalic and atraumatic. Neck:  Supple; no masses or thyromegaly. Lungs:  Clear  throughout to auscultation, normal respiratory effort.    Heart:  +S1, +S2, Regular rate and rhythm, No edema. Abdomen:  Soft, nontender and nondistended. Normal bowel sounds, without guarding, and without rebound.   Neurologic:  Alert and  oriented x4;  grossly normal neurologically.  Impression/Plan: Yolanda Guerrero is here for an endoscopy and colonoscopy  to be performed for  evaluation of abdominal pain and diarrhea    Risks, benefits, limitations, and alternatives regarding endoscopy have been reviewed with the patient.  Questions have been answered.  All parties agreeable.   Wyline Mood, MD   02/09/2023, 7:40 AM

## 2023-02-09 NOTE — Op Note (Signed)
[facility] Gastroenterology Patient Name: Yolanda Guerrero Procedure Date: 02/09/2023 7:33 AM MRN: [id number] Account #: [id number] Date of Birth: [birth year] Admit Type: Outpatient Age: 62 Room: [facility] ENDO ROOM 3 Gender: Female Note Status: Finalized Instrument Name: Upper Endoscope [id number] Procedure:             Upper GI endoscopy Indications:           Diarrhea Providers:             Wyline Mood MD, MD Medicines:             Monitored Anesthesia Care Complications:         No immediate complications. Procedure:             Pre-Anesthesia Assessment:                        - Prior to the procedure, a History and Physical was                         performed, and patient medications, allergies and                         sensitivities were reviewed. The patient's tolerance                         of previous anesthesia was reviewed.                        - The risks and benefits of the procedure and the                         sedation options and risks were discussed with the                         patient. All questions were answered and informed                         consent was obtained.                        - ASA Grade Assessment: II - A patient with mild                         systemic disease.                        After obtaining informed consent, the endoscope was                         passed under direct vision. Throughout the procedure,                         the patient's blood pressure, pulse, and oxygen                         saturations were monitored continuously. The Endoscope                         was introduced through the mouth, and advanced to the  third part of duodenum. The upper GI endoscopy was                         accomplished with ease. The patient tolerated the                         procedure well. Findings:      The esophagus was normal.      The stomach was normal.      The cardia and gastric  fundus were normal on retroflexion.      The examined duodenum was normal. Biopsies for histology were taken with       a cold forceps for evaluation of celiac disease. Impression:            - Normal esophagus.                        - Normal stomach.                        - Normal examined duodenum. Biopsied. Recommendation:        - Await pathology results.                        - Perform a colonoscopy today. Procedure Code(s):     --- Professional ---                        [phone], Esophagogastroduodenoscopy, flexible,                         transoral; with biopsy, single or multiple Diagnosis Code(s):     --- Professional ---                        R19.7, Diarrhea, unspecified CPT copyright 2022 American Medical Association. All rights reserved. The codes documented in this report are preliminary and upon coder review may  be revised to meet current compliance requirements. Wyline Mood, MD Wyline Mood MD, MD 02/09/2023 7:50:09 AM This report has been signed electronically. Number of Addenda: 0 Note Initiated On: 02/09/2023 7:33 AM Estimated Blood Loss:  Estimated blood loss: none.      [facility]

## 2023-02-10 ENCOUNTER — Encounter: Payer: Self-pay | Admitting: Gastroenterology

## 2023-02-10 NOTE — Anesthesia Postprocedure Evaluation (Signed)
Anesthesia Post Note  Patient: Yolanda Guerrero  Procedure(s) Performed: COLONOSCOPY WITH PROPOFOL ESOPHAGOGASTRODUODENOSCOPY (EGD) WITH PROPOFOL BIOPSY  Patient location during evaluation: Endoscopy Anesthesia Type: General Level of consciousness: awake and alert Pain management: pain level controlled Vital Signs Assessment: post-procedure vital signs reviewed and stable Respiratory status: spontaneous breathing, nonlabored ventilation, respiratory function stable and patient connected to nasal cannula oxygen Cardiovascular status: blood pressure returned to baseline and stable Postop Assessment: no apparent nausea or vomiting Anesthetic complications: no   There were no known notable events for this encounter.   Last Vitals:  Vitals:   02/09/23 0820 02/09/23 0830  BP: 118/72 121/76  Pulse: 93 94  Resp: 19 17  Temp:    SpO2: 98% 99%    Last Pain:  Vitals:   02/09/23 0830  TempSrc:   PainSc: 0-No pain                 Lenard Simmer

## 2023-02-14 ENCOUNTER — Encounter: Payer: Self-pay | Admitting: Gastroenterology

## 2023-03-09 ENCOUNTER — Ambulatory Visit: Payer: BC Managed Care – PPO | Admitting: Physician Assistant

## 2023-04-14 NOTE — Progress Notes (Signed)
Celso Amy, PA-C [address]  [address]  [place], [place] [address]  Main: [phone]  Fax: [phone]   Primary Care Physician: Armando Gang, FNP  Primary Gastroenterologist:  Celso Amy, PA-C / Dr. Wyline Mood    CC: F/U epigastric pain, IBS-D, GERD, rectal bleeding  HPI: INA SCRIVENS is a 62 y.o. female returns for 53-month follow-up of epigastric pain, IBS-D, GERD, rectal bleeding, and internal hemorrhoids.  Currently she is feeling a lot better.  She has never drank alcohol.  She has been avoiding fast foods.  Occasionally takes Pepto-Bismol which helps with sporadic diarrhea.  She tried Imodium which caused stomach pain and discontinued.  She is not currently taking any Pepcid or regular medications.  Currently having normal bowel movement once or twice daily with a rare episode of diarrhea.  Symptoms improved with avoiding trigger foods.  She denies abdominal pain or recent heartburn.  Stool Studies 07/2022: Negative GI pathogen panel and C. Difficile. Labs 11/2022: Normal CBC and CMP.  Mildly elevated lipase 116.  Normal LFTs.  Normal hemoglobin and 13.6.  Abdominal pelvic CT with contrast 12/21/2022 (for elevated lipase): Mild intrahepatic and marked extrahepatic biliary ductal dilatation, which could be related to prior cholecystectomy, however recommend follow-up MRCP to exclude ampullary lesion.  Abdominal MRI MRCP with and without contrast 01/06/2023: Right hepatic lobe cyst (1.8 cm), mild biliary dilatation without obstruction, variant hepatic ductal anatomy, pancreatic divisum, bilateral renal cysts.  No evidence of masses or abnormal lesions.  EGD 02/09/2023 by Dr. Tobi Bastos was normal.  Duodenal biopsies negative for celiac.  Colonoscopy 02/09/2023 showed sigmoid diverticulosis, otherwise normal.  No polyps.  Biopsies were negative for microscopic colitis.   GI History: EGD 01/2017 by Dr. Marva Panda showed mild gastritis, otherwise normal.  Biopsies NEGATIVE for  celiac, H. pylori, and Barrett's.   Colonoscopy in 01/2017 by Dr. Marva Panda showed nonbleeding small internal hemorrhoids.  Otherwise was normal.  No polyps.  Biopsies negative for microscopic colitis.   Current Outpatient Medications  Medication Sig Dispense Refill   aspirin EC 81 MG tablet Take 81 mg by mouth daily. (Patient not taking: Reported on 02/09/2023)     famotidine (PEPCID) 40 MG tablet Take 40 mg by mouth daily. (Patient not taking: Reported on 02/09/2023)     valACYclovir (VALTREX) 500 MG tablet Take 1,000 mg by mouth 3 (three) times daily. (Patient not taking: Reported on 02/09/2023)     No current facility-administered medications for this visit.    Allergies as of 04/15/2023 - Review Complete 02/09/2023  Allergen Reaction Noted   Levaquin [levofloxacin in d5w] Other (See Comments) 01/20/2017   Levofloxacin Other (See Comments) 05/14/2016   Penicillins Hives 09/18/2013   Codeine Nausea And Vomiting and Nausea Only 09/18/2013   Fexofenadine Nausea Only 09/18/2013   Fexofenadine hcl Nausea Only 01/20/2017   Oxycodone-acetaminophen Nausea And Vomiting 11/07/2015   Tramadol Nausea And Vomiting and Nausea Only 09/23/2015    Past Medical History:  Diagnosis Date   Arthritis    IBS (irritable bowel syndrome)    Thyroid disease     Past Surgical History:  Procedure Laterality Date   ABDOMINAL HYSTERECTOMY     WITH PARTIAL VAGINACTOMY   BIOPSY  02/09/2023   Procedure: BIOPSY;  Surgeon: Wyline Mood, MD;  Location: [facility] ENDOSCOPY;  Service: Gastroenterology;;   CHOLECYSTECTOMY     COLONOSCOPY WITH PROPOFOL N/A 01/21/2017   Procedure: COLONOSCOPY WITH PROPOFOL;  Surgeon: Christena Deem, MD;  Location: [facility] ENDOSCOPY;  Service:  Endoscopy;  Laterality: N/A;   COLONOSCOPY WITH PROPOFOL N/A 02/09/2023   Procedure: COLONOSCOPY WITH PROPOFOL;  Surgeon: Wyline Mood, MD;  Location: [facility] ENDOSCOPY;  Service: Gastroenterology;  Laterality: N/A;   ESOPHAGOGASTRODUODENOSCOPY (EGD)  WITH PROPOFOL N/A 01/21/2017   Procedure: ESOPHAGOGASTRODUODENOSCOPY (EGD) WITH PROPOFOL;  Surgeon: Christena Deem, MD;  Location: [facility] ENDOSCOPY;  Service: Endoscopy;  Laterality: N/A;   ESOPHAGOGASTRODUODENOSCOPY (EGD) WITH PROPOFOL N/A 02/09/2023   Procedure: ESOPHAGOGASTRODUODENOSCOPY (EGD) WITH PROPOFOL;  Surgeon: Wyline Mood, MD;  Location: [facility] ENDOSCOPY;  Service: Gastroenterology;  Laterality: N/A;    Review of Systems:    All systems reviewed and negative except where noted in HPI.   Physical Examination:   There were no vitals taken for this visit.  General: Well-nourished, well-developed in no acute distress.  Lungs: Clear to auscultation bilaterally. Non-labored. Heart: Regular rate and rhythm, no murmurs rubs or gallops.  Abdomen: Bowel sounds are normal; Abdomen is Soft; No hepatosplenomegaly, masses or hernias;  No Abdominal Tenderness; No guarding or rebound tenderness. Neuro: Alert and oriented x 3.  Grossly intact.  Psych: Alert and cooperative, normal mood and affect.   Imaging Studies: No results found.  Assessment and Plan:   SONI KEGEL is a 62 y.o. y/o female returns for follow-up of:  1.  Irritable bowel syndrome, diarrhea predominant; recent normal colonoscopy and biopsies negative for microscopic colitis  Low FODMAP diet    2.  GERD: Recent normal EGD and biopsies negative for celiac.  Continue OTC Famotidine prn.  GERD Diet  3.  Elevated lipase; Pancreas Divisum;  MRI / MRCP otherwise unremarkable.  Lab: Repeat lipase  Avoid Alcohol  Low Fat Diet  4.  Pancreas Divisum  Reassurance.  Patient education given.  Celso Amy, PA-C  Follow up as needed if she has any recurrent GI symptoms.

## 2023-04-15 ENCOUNTER — Encounter: Payer: Self-pay | Admitting: Physician Assistant

## 2023-04-15 ENCOUNTER — Ambulatory Visit: Payer: BC Managed Care – PPO | Admitting: Physician Assistant

## 2023-04-15 VITALS — BP 111/73 | HR 78 | Temp 97.7°F | Ht 63.0 in | Wt 170.4 lb

## 2023-04-15 DIAGNOSIS — R748 Abnormal levels of other serum enzymes: Secondary | ICD-10-CM | POA: Diagnosis not present

## 2023-04-15 DIAGNOSIS — K219 Gastro-esophageal reflux disease without esophagitis: Secondary | ICD-10-CM

## 2023-04-15 DIAGNOSIS — K580 Irritable bowel syndrome with diarrhea: Secondary | ICD-10-CM

## 2023-04-15 DIAGNOSIS — Q453 Other congenital malformations of pancreas and pancreatic duct: Secondary | ICD-10-CM

## 2023-04-15 NOTE — Patient Instructions (Signed)
Pancreas divisum is the most common birth defect of the pancreas. In many cases, this defect goes undetected and causes no problems.

## 2023-04-16 LAB — LIPASE: Lipase: 39 U/L (ref 14–72)
